# Patient Record
Sex: Male | Born: 1965 | Race: Black or African American | Hispanic: No | Marital: Married | Smoking: Never smoker
Health system: Southern US, Community
[De-identification: ages and names within clinical notes are randomized; demographics above are authoritative.]

## PROBLEM LIST (undated history)

## (undated) DIAGNOSIS — J189 Pneumonia, unspecified organism: Secondary | ICD-10-CM

## (undated) DIAGNOSIS — I509 Heart failure, unspecified: Secondary | ICD-10-CM

## (undated) DIAGNOSIS — I10 Essential (primary) hypertension: Secondary | ICD-10-CM

## (undated) DIAGNOSIS — J45909 Unspecified asthma, uncomplicated: Secondary | ICD-10-CM

---

## 1998-06-10 ENCOUNTER — Emergency Department (HOSPITAL_COMMUNITY): Admission: EM | Admit: 1998-06-10 | Discharge: 1998-06-10 | Payer: Self-pay

## 2010-07-29 ENCOUNTER — Emergency Department (INDEPENDENT_AMBULATORY_CARE_PROVIDER_SITE_OTHER): Payer: Self-pay

## 2010-07-29 ENCOUNTER — Emergency Department (HOSPITAL_BASED_OUTPATIENT_CLINIC_OR_DEPARTMENT_OTHER)
Admission: EM | Admit: 2010-07-29 | Discharge: 2010-07-30 | Disposition: A | Discharge status: Home / Self Care | Payer: Self-pay | Attending: Emergency Medicine | Admitting: Emergency Medicine

## 2010-07-29 DIAGNOSIS — I10 Essential (primary) hypertension: Secondary | ICD-10-CM | POA: Insufficient documentation

## 2010-07-29 DIAGNOSIS — M542 Cervicalgia: Secondary | ICD-10-CM

## 2010-07-29 DIAGNOSIS — X58XXXA Exposure to other specified factors, initial encounter: Secondary | ICD-10-CM | POA: Insufficient documentation

## 2010-07-29 DIAGNOSIS — Y92009 Unspecified place in unspecified non-institutional (private) residence as the place of occurrence of the external cause: Secondary | ICD-10-CM | POA: Insufficient documentation

## 2010-07-29 DIAGNOSIS — W19XXXA Unspecified fall, initial encounter: Secondary | ICD-10-CM

## 2010-07-29 DIAGNOSIS — S139XXA Sprain of joints and ligaments of unspecified parts of neck, initial encounter: Secondary | ICD-10-CM | POA: Insufficient documentation

## 2010-07-29 DIAGNOSIS — M5030 Other cervical disc degeneration, unspecified cervical region: Secondary | ICD-10-CM

## 2012-02-12 ENCOUNTER — Encounter (HOSPITAL_BASED_OUTPATIENT_CLINIC_OR_DEPARTMENT_OTHER): Payer: Self-pay | Admitting: *Deleted

## 2012-02-12 ENCOUNTER — Emergency Department (HOSPITAL_BASED_OUTPATIENT_CLINIC_OR_DEPARTMENT_OTHER)
Admission: EM | Admit: 2012-02-12 | Discharge: 2012-02-12 | Disposition: A | Discharge status: Home / Self Care | Payer: BC Managed Care – PPO | Attending: Emergency Medicine | Admitting: Emergency Medicine

## 2012-02-12 DIAGNOSIS — R197 Diarrhea, unspecified: Secondary | ICD-10-CM | POA: Insufficient documentation

## 2012-02-12 DIAGNOSIS — A088 Other specified intestinal infections: Secondary | ICD-10-CM | POA: Insufficient documentation

## 2012-02-12 DIAGNOSIS — R112 Nausea with vomiting, unspecified: Secondary | ICD-10-CM | POA: Insufficient documentation

## 2012-02-12 DIAGNOSIS — A084 Viral intestinal infection, unspecified: Secondary | ICD-10-CM

## 2012-02-12 DIAGNOSIS — I10 Essential (primary) hypertension: Secondary | ICD-10-CM | POA: Insufficient documentation

## 2012-02-12 DIAGNOSIS — Z79899 Other long term (current) drug therapy: Secondary | ICD-10-CM | POA: Insufficient documentation

## 2012-02-12 HISTORY — DX: Essential (primary) hypertension: I10

## 2012-02-12 LAB — COMPREHENSIVE METABOLIC PANEL
AST: 30 U/L (ref 0–37)
Albumin: 3.1 g/dL — ABNORMAL LOW (ref 3.5–5.2)
Chloride: 105 mEq/L (ref 96–112)
Creatinine, Ser: 1.8 mg/dL — ABNORMAL HIGH (ref 0.50–1.35)
Sodium: 141 mEq/L (ref 135–145)
Total Bilirubin: 1.5 mg/dL — ABNORMAL HIGH (ref 0.3–1.2)

## 2012-02-12 LAB — URINE MICROSCOPIC-ADD ON

## 2012-02-12 LAB — URINALYSIS, ROUTINE W REFLEX MICROSCOPIC
Leukocytes, UA: NEGATIVE
Nitrite: NEGATIVE
Specific Gravity, Urine: 1.025 (ref 1.005–1.030)
Urobilinogen, UA: 1 mg/dL (ref 0.0–1.0)
pH: 6.5 (ref 5.0–8.0)

## 2012-02-12 LAB — CBC WITH DIFFERENTIAL/PLATELET
Basophils Absolute: 0 10*3/uL (ref 0.0–0.1)
Basophils Relative: 0 % (ref 0–1)
HCT: 40.8 % (ref 39.0–52.0)
MCHC: 32.4 g/dL (ref 30.0–36.0)
Monocytes Absolute: 0.6 10*3/uL (ref 0.1–1.0)
Neutro Abs: 4.7 10*3/uL (ref 1.7–7.7)
Neutrophils Relative %: 71 % (ref 43–77)
Platelets: 219 10*3/uL (ref 150–400)
RDW: 16.7 % — ABNORMAL HIGH (ref 11.5–15.5)

## 2012-02-12 MED ORDER — ONDANSETRON HCL 4 MG PO TABS: 4.0000 mg | ORAL_TABLET | Freq: Three times a day (TID) | ORAL | Status: DC | PRN

## 2012-02-12 MED ORDER — ONDANSETRON HCL 4 MG/2ML IJ SOLN
4.0000 mg | Freq: Once | INTRAMUSCULAR | Status: AC
  Administered 2012-02-12: 4 mg via INTRAVENOUS
  Filled 2012-02-12: qty 2

## 2012-02-12 MED ORDER — SODIUM CHLORIDE 0.9 % IV BOLUS (SEPSIS)
1000.0000 mL | Freq: Once | INTRAVENOUS | Status: AC
  Administered 2012-02-12: 1000 mL via INTRAVENOUS

## 2012-02-12 MED ORDER — MORPHINE SULFATE 4 MG/ML IJ SOLN
4.0000 mg | Freq: Once | INTRAMUSCULAR | Status: AC
  Administered 2012-02-12: 4 mg via INTRAVENOUS
  Filled 2012-02-12: qty 1

## 2012-02-12 NOTE — ED Notes (Signed)
Abdominal pain x 1 week. Diarrhea, fever and vomiting.

## 2012-02-12 NOTE — ED Provider Notes (Signed)
History  This chart was scribed for Charles B. Bernette Mayers, MD by Ardeen Jourdain, ED Scribe. This patient was seen in room MH02/MH02 and the patient's care was started at 1543.  CSN: [id number]  Arrival date & time 02/12/12  1457   First MD Initiated Contact with Patient 02/12/12 1543      Chief Complaint  Patient presents with  . Abdominal Pain     The history is provided by the patient. No language interpreter was used.    Brandon Snyder is a 47 y.o. male who presents to the Emergency Department complaining of gradually worsening abdominal pain that began 1 week ago with associated loss of appetite, diarrhea, nausea and emesis. He states he had the flu last week but the symptoms resolved themselves. He describes the pain as constant and diffuse. He states the pain is aggravated by movement and walking. He describes his stool yesterday as a bright green color but states he has been drinking purple Gatorade. He reports he has been unable to take his HTN medication due to the emesis.    Past Medical History  Diagnosis Date  . Hypertension     History reviewed. No pertinent past surgical history.  No family history on file.  History  Substance Use Topics  . Smoking status: Never Smoker   . Smokeless tobacco: Not on file  . Alcohol Use: Yes      Review of Systems  All other systems reviewed and are negative.   A complete 10 system review of systems was obtained and all systems are negative except as noted in the HPI and PMH.    Allergies  Review of patient's allergies indicates no known allergies.  Home Medications   Current Outpatient Rx  Name  Route  Sig  Dispense  Refill  . LISINOPRIL PO   Oral   Take by mouth.           Triage Vitals: BP 168/120  Pulse 84  Temp 98.7 F (37.1 C) (Oral)  Resp 18  Ht 6\' 2"  (1.88 m)  Wt 295 lb (133.811 kg)  BMI 37.88 kg/m2  SpO2 98%  Physical Exam  Nursing note and vitals reviewed. Constitutional: He is oriented to  person, place, and time. He appears well-developed and well-nourished.  HENT:  Head: Normocephalic and atraumatic.  Eyes: Conjunctivae normal and EOM are normal. Pupils are equal, round, and reactive to light.  Neck: Normal range of motion. Neck supple.  Cardiovascular: Normal rate, regular rhythm, normal heart sounds and intact distal pulses.  Exam reveals no gallop and no friction rub.   No murmur heard. Pulmonary/Chest: Effort normal and breath sounds normal. No respiratory distress.  Abdominal: Soft. Bowel sounds are normal. He exhibits no distension. There is tenderness. There is guarding. There is no rebound.       Moderate diffuse tenderness  Musculoskeletal: Normal range of motion. He exhibits edema. He exhibits no tenderness.       Trace edema  Neurological: He is alert and oriented to person, place, and time. He has normal strength. No cranial nerve deficit or sensory deficit.  Skin: Skin is warm and dry. No rash noted.  Psychiatric: He has a normal mood and affect. His behavior is normal.    ED Course  Procedures (including critical care time)  DIAGNOSTIC STUDIES: Oxygen Saturation is 98% on room air, normal by my interpretation.    COORDINATION OF CARE:  3:45 PM: Discussed treatment plan which includes IV fluids, UA, CBC, CMP,  pain medication and anti-nausea medication with pt at bedside and pt agreed to plan.   5:05 PM: Pt rechecked, pt states he is feeling better, abdomen now benign     Results for orders placed during the hospital encounter of 02/12/12  URINALYSIS, ROUTINE W REFLEX MICROSCOPIC      Component Value Range   Color, Urine AMBER (*) YELLOW   APPearance CLEAR  CLEAR   Specific Gravity, Urine 1.025  1.005 - 1.030   pH 6.5  5.0 - 8.0   Glucose, UA NEGATIVE  NEGATIVE mg/dL   Hgb urine dipstick NEGATIVE  NEGATIVE   Bilirubin Urine SMALL (*) NEGATIVE   Ketones, ur NEGATIVE  NEGATIVE mg/dL   Protein, ur >914 (*) NEGATIVE mg/dL   Urobilinogen, UA 1.0  0.0  - 1.0 mg/dL   Nitrite NEGATIVE  NEGATIVE   Leukocytes, UA NEGATIVE  NEGATIVE  URINE MICROSCOPIC-ADD ON      Component Value Range   Squamous Epithelial / LPF RARE  RARE   Bacteria, UA RARE  RARE  CBC WITH DIFFERENTIAL      Component Value Range   WBC 6.6  4.0 - 10.5 K/uL   RBC 4.99  4.22 - 5.81 MIL/uL   Hemoglobin 13.2  13.0 - 17.0 g/dL   HCT 78.2  95.6 - 21.3 %   MCV 81.8  78.0 - 100.0 fL   MCH 26.5  26.0 - 34.0 pg   MCHC 32.4  30.0 - 36.0 g/dL   RDW 08.6 (*) 57.8 - 46.9 %   Platelets 219  150 - 400 K/uL   Neutrophils Relative 71  43 - 77 %   Neutro Abs 4.7  1.7 - 7.7 K/uL   Lymphocytes Relative 17  12 - 46 %   Lymphs Abs 1.1  0.7 - 4.0 K/uL   Monocytes Relative 9  3 - 12 %   Monocytes Absolute 0.6  0.1 - 1.0 K/uL   Eosinophils Relative 2  0 - 5 %   Eosinophils Absolute 0.1  0.0 - 0.7 K/uL   Basophils Relative 0  0 - 1 %   Basophils Absolute 0.0  0.0 - 0.1 K/uL  COMPREHENSIVE METABOLIC PANEL      Component Value Range   Sodium 141  135 - 145 mEq/L   Potassium 3.9  3.5 - 5.1 mEq/L   Chloride 105  96 - 112 mEq/L   CO2 25  19 - 32 mEq/L   Glucose, Bld 99  70 - 99 mg/dL   BUN 23  6 - 23 mg/dL   Creatinine, Ser 6.29 (*) 0.50 - 1.35 mg/dL   Calcium 8.7  8.4 - 52.8 mg/dL   Total Protein 5.5 (*) 6.0 - 8.3 g/dL   Albumin 3.1 (*) 3.5 - 5.2 g/dL   AST 30  0 - 37 U/L   ALT 38  0 - 53 U/L   Alkaline Phosphatase 57  39 - 117 U/L   Total Bilirubin 1.5 (*) 0.3 - 1.2 mg/dL   GFR calc non Af Amer 44 (*) >90 mL/min   GFR calc Af Amer 51 (*) >90 mL/min  LIPASE, BLOOD      Component Value Range   Lipase 13  11 - 59 U/L     No results found.   No diagnosis found.    MDM  Labs unremarkable. Pt feeling better. Likely a viral process. Doubt acute surgical process. Plan for discharge home with nausea meds as needed.     I  personally performed the services described in this documentation, which was scribed in my presence. The recorded information has been reviewed and is  accurate.       Charles B. Bernette Mayers, MD 02/12/12 1801

## 2012-04-03 ENCOUNTER — Emergency Department (HOSPITAL_BASED_OUTPATIENT_CLINIC_OR_DEPARTMENT_OTHER)
Admission: EM | Admit: 2012-04-03 | Discharge: 2012-04-04 | Disposition: A | Discharge status: Home / Self Care | Payer: BC Managed Care – PPO | Attending: Emergency Medicine | Admitting: Emergency Medicine

## 2012-04-03 ENCOUNTER — Emergency Department (HOSPITAL_BASED_OUTPATIENT_CLINIC_OR_DEPARTMENT_OTHER): Payer: BC Managed Care – PPO

## 2012-04-03 ENCOUNTER — Encounter (HOSPITAL_BASED_OUTPATIENT_CLINIC_OR_DEPARTMENT_OTHER): Payer: Self-pay | Admitting: *Deleted

## 2012-04-03 DIAGNOSIS — R609 Edema, unspecified: Secondary | ICD-10-CM | POA: Insufficient documentation

## 2012-04-03 DIAGNOSIS — I10 Essential (primary) hypertension: Secondary | ICD-10-CM | POA: Insufficient documentation

## 2012-04-03 DIAGNOSIS — I509 Heart failure, unspecified: Secondary | ICD-10-CM | POA: Insufficient documentation

## 2012-04-03 DIAGNOSIS — R0789 Other chest pain: Secondary | ICD-10-CM | POA: Insufficient documentation

## 2012-04-03 DIAGNOSIS — Z79899 Other long term (current) drug therapy: Secondary | ICD-10-CM | POA: Insufficient documentation

## 2012-04-03 NOTE — ED Notes (Signed)
Patient was diagnosed with pneumonia two weeks ago, finished antibiotic that was given. Feels like he's getting worse and states hes wheezing.

## 2012-04-03 NOTE — ED Provider Notes (Signed)
History     CSN: [id number]  Arrival date & time 04/03/12  2140   First MD Initiated Contact with Patient 04/03/12 2358      Chief Complaint  Patient presents with  . Shortness of Breath    (Consider location/radiation/quality/duration/timing/severity/associated sxs/prior treatment) HPI This 47 year old male with a history of hypertension and possibly CHF. He was seen in urgent care 2 weeks ago for shortness of breath and cough and was treated with an antibiotic for presumed pneumonia. Despite this he continues to have wheezing, dyspnea on exertion, orthopnea and generalized weakness. He continues to have paroxysms of cough sometimes with posttussive emesis. His appetite has been poor due to upset stomach after eating. The symptoms are moderate and worse with exertion and lying flat is noted. He has noted that his lower legs and feet have become edematous over the past few days. He has also had some intermittent sharp right parasternal pain today.  Past Medical History  Diagnosis Date  . Hypertension     History reviewed. No pertinent past surgical history.  No family history on file.  History  Substance Use Topics  . Smoking status: Never Smoker   . Smokeless tobacco: Not on file  . Alcohol Use: Yes      Review of Systems  All other systems reviewed and are negative.    Allergies  Review of patient's allergies indicates no known allergies.  Home Medications   Current Outpatient Rx  Name  Route  Sig  Dispense  Refill  . LISINOPRIL PO   Oral   Take by mouth.         . ondansetron (ZOFRAN) 4 MG tablet   Oral   Take 1 tablet (4 mg total) by mouth every 8 (eight) hours as needed for nausea.   12 tablet   0     BP 163/107  Pulse 97  Temp(Src) 98.6 F (37 C) (Oral)  Resp 30  Ht 6\' 2"  (1.88 m)  Wt 295 lb (133.811 kg)  BMI 37.86 kg/m2  SpO2 98%  Physical Exam General: Well-developed, well-nourished male in no acute distress; appearance consistent with  age of record HENT: normocephalic, atraumatic Eyes: pupils equal round and reactive to light; extraocular muscles intact Neck: supple Heart: regular rate and rhythm; no murmurs Lungs: clear to auscultation bilaterally Abdomen: soft; nondistended; bowel sounds present Extremities: No deformity; full range of motion; 2+ edema of the lower legs Neurologic: Awake, alert and oriented; motor function intact in all extremities and symmetric; no facial droop Skin: Warm and dry Psychiatric: Normal mood and affect    ED Course  Procedures (including critical care time)    MDM  Nursing notes and vitals signs, including pulse oximetry, reviewed.  Summary of this visit's results, reviewed by myself:  Labs:  Results for orders placed during the hospital encounter of 04/03/12 (from the past 24 hour(s))  CBC WITH DIFFERENTIAL     Status: None   Collection Time    04/04/12 12:10 AM      Result Value Range   WBC 5.7  4.0 - 10.5 K/uL   RBC 5.20  4.22 - 5.81 MIL/uL   Hemoglobin 13.8  13.0 - 17.0 g/dL   HCT 78.2  95.6 - 21.3 %   MCV 79.2  78.0 - 100.0 fL   MCH 26.5  26.0 - 34.0 pg   MCHC 33.5  30.0 - 36.0 g/dL   RDW 08.6  57.8 - 46.9 %   Platelets 249  150 -  400 K/uL   Neutrophils Relative 69  43 - 77 %   Neutro Abs 3.9  1.7 - 7.7 K/uL   Lymphocytes Relative 17  12 - 46 %   Lymphs Abs 1.0  0.7 - 4.0 K/uL   Monocytes Relative 11  3 - 12 %   Monocytes Absolute 0.6  0.1 - 1.0 K/uL   Eosinophils Relative 2  0 - 5 %   Eosinophils Absolute 0.1  0.0 - 0.7 K/uL   Basophils Relative 0  0 - 1 %   Basophils Absolute 0.0  0.0 - 0.1 K/uL  BASIC METABOLIC PANEL     Status: Abnormal   Collection Time    04/04/12 12:10 AM      Result Value Range   Sodium 138  135 - 145 mEq/L   Potassium 3.9  3.5 - 5.1 mEq/L   Chloride 101  96 - 112 mEq/L   CO2 24  19 - 32 mEq/L   Glucose, Bld 130 (*) 70 - 99 mg/dL   BUN 32 (*) 6 - 23 mg/dL   Creatinine, Ser 2.95 (*) 0.50 - 1.35 mg/dL   Calcium 8.7  8.4 - 62.1  mg/dL   GFR calc non Af Amer 47 (*) >90 mL/min   GFR calc Af Amer 54 (*) >90 mL/min  TROPONIN I     Status: None   Collection Time    04/04/12 12:10 AM      Result Value Range   Troponin I <0.30  <0.30 ng/mL  PRO B NATRIURETIC PEPTIDE     Status: Abnormal   Collection Time    04/04/12 12:10 AM      Result Value Range   Pro B Natriuretic peptide (BNP) 5852.0 (*) 0 - 125 pg/mL    Imaging Studies: Dg Chest 2 View  04/03/2012  *RADIOLOGY REPORT*  Clinical Data: Shortness of breath  CHEST - 2 VIEW  Comparison: None.  Findings: Cardiomegaly.  Central vascular congestion.  Mild interstitial prominence.  No confluent airspace opacity.  No pleural effusion or pneumothorax.  No acute osseous finding.  IMPRESSION: Cardiomegaly with central vascular congestion.  Mild interstitial prominence may reflect mild interstitial edema.   Original Report Authenticated By: Jearld Lesch, M.D.     EKG Interpretation:  Date & Time: 04/04/2012 12:21 AM  Rate: 90  Rhythm: normal sinus rhythm  QRS Axis: normal  Intervals: Prolonged QT  ST/T Wave abnormalities: Diffuse T-wave inversions  Conduction Disutrbances:left anterior fascicular block  Narrative Interpretation:   Old EKG Reviewed: none available  12:47 AM Patient states that he has not been taking medication for CHF in about 3 years. He states he does continue to take lisinopril. We will restart him on Lasix and have him followup with [facility].          Hanley Seamen, MD 04/04/12 [phone]

## 2012-04-03 NOTE — ED Notes (Signed)
Pt states he gets SOB with exertion and has been having bilateral lower leg swelling. Pt has pitting edema to bilateral lower extremities. SR on monitor, denies CP at this time but still c/o SOB.

## 2012-04-04 LAB — CBC WITH DIFFERENTIAL/PLATELET
Eosinophils Relative: 2 % (ref 0–5)
HCT: 41.2 % (ref 39.0–52.0)
Lymphocytes Relative: 17 % (ref 12–46)
Lymphs Abs: 1 10*3/uL (ref 0.7–4.0)
MCV: 79.2 fL (ref 78.0–100.0)
Monocytes Absolute: 0.6 10*3/uL (ref 0.1–1.0)
RBC: 5.2 MIL/uL (ref 4.22–5.81)
WBC: 5.7 10*3/uL (ref 4.0–10.5)

## 2012-04-04 LAB — BASIC METABOLIC PANEL
CO2: 24 mEq/L (ref 19–32)
Calcium: 8.7 mg/dL (ref 8.4–10.5)
Creatinine, Ser: 1.7 mg/dL — ABNORMAL HIGH (ref 0.50–1.35)
Glucose, Bld: 130 mg/dL — ABNORMAL HIGH (ref 70–99)

## 2012-04-04 MED ORDER — FUROSEMIDE 40 MG PO TABS: 40.0000 mg | ORAL_TABLET | Freq: Every day | ORAL | Status: DC

## 2012-04-04 MED ORDER — FUROSEMIDE 10 MG/ML IJ SOLN
40.0000 mg | Freq: Once | INTRAMUSCULAR | Status: AC
  Administered 2012-04-04: 40 mg via INTRAVENOUS
  Filled 2012-04-04: qty 4

## 2012-04-04 MED ORDER — PROMETHAZINE HCL 25 MG PO TABS: 25.0000 mg | ORAL_TABLET | Freq: Four times a day (QID) | ORAL | Status: DC | PRN

## 2012-04-04 NOTE — ED Notes (Signed)
MD at bedside. 

## 2012-05-07 ENCOUNTER — Emergency Department (HOSPITAL_BASED_OUTPATIENT_CLINIC_OR_DEPARTMENT_OTHER): Payer: Self-pay

## 2012-05-07 ENCOUNTER — Inpatient Hospital Stay (HOSPITAL_BASED_OUTPATIENT_CLINIC_OR_DEPARTMENT_OTHER)
Admission: EM | Admit: 2012-05-07 | Discharge: 2012-05-11 | DRG: 292 | Disposition: A | Discharge status: Home / Self Care | Payer: MEDICAID | Attending: Internal Medicine | Admitting: Internal Medicine

## 2012-05-07 ENCOUNTER — Encounter (HOSPITAL_BASED_OUTPATIENT_CLINIC_OR_DEPARTMENT_OTHER): Payer: Self-pay | Admitting: *Deleted

## 2012-05-07 ENCOUNTER — Other Ambulatory Visit: Payer: Self-pay

## 2012-05-07 DIAGNOSIS — I509 Heart failure, unspecified: Secondary | ICD-10-CM | POA: Diagnosis present

## 2012-05-07 DIAGNOSIS — I10 Essential (primary) hypertension: Secondary | ICD-10-CM | POA: Diagnosis present

## 2012-05-07 DIAGNOSIS — I110 Hypertensive heart disease with heart failure: Principal | ICD-10-CM | POA: Diagnosis present

## 2012-05-07 DIAGNOSIS — I472 Ventricular tachycardia, unspecified: Secondary | ICD-10-CM | POA: Diagnosis present

## 2012-05-07 DIAGNOSIS — N183 Chronic kidney disease, stage 3 unspecified: Secondary | ICD-10-CM | POA: Diagnosis present

## 2012-05-07 DIAGNOSIS — I4729 Other ventricular tachycardia: Secondary | ICD-10-CM | POA: Diagnosis present

## 2012-05-07 DIAGNOSIS — E876 Hypokalemia: Secondary | ICD-10-CM | POA: Diagnosis present

## 2012-05-07 DIAGNOSIS — I5021 Acute systolic (congestive) heart failure: Secondary | ICD-10-CM | POA: Diagnosis present

## 2012-05-07 HISTORY — DX: Unspecified asthma, uncomplicated: J45.909

## 2012-05-07 HISTORY — DX: Heart failure, unspecified: I50.9

## 2012-05-07 HISTORY — DX: Pneumonia, unspecified organism: J18.9

## 2012-05-07 LAB — CBC WITH DIFFERENTIAL/PLATELET
Basophils Absolute: 0 10*3/uL (ref 0.0–0.1)
Basophils Relative: 0 % (ref 0–1)
Eosinophils Relative: 4 % (ref 0–5)
HCT: 37.7 % — ABNORMAL LOW (ref 39.0–52.0)
Hemoglobin: 12.3 g/dL — ABNORMAL LOW (ref 13.0–17.0)
MCH: 26.2 pg (ref 26.0–34.0)
MCHC: 32.6 g/dL (ref 30.0–36.0)
MCV: 80.2 fL (ref 78.0–100.0)
Monocytes Absolute: 0.7 10*3/uL (ref 0.1–1.0)
Monocytes Relative: 13 % — ABNORMAL HIGH (ref 3–12)
RDW: 17.4 % — ABNORMAL HIGH (ref 11.5–15.5)

## 2012-05-07 LAB — COMPREHENSIVE METABOLIC PANEL
AST: 30 U/L (ref 0–37)
Albumin: 2.9 g/dL — ABNORMAL LOW (ref 3.5–5.2)
BUN: 18 mg/dL (ref 6–23)
Calcium: 8.9 mg/dL (ref 8.4–10.5)
Chloride: 104 mEq/L (ref 96–112)
Creatinine, Ser: 1.6 mg/dL — ABNORMAL HIGH (ref 0.50–1.35)
GFR calc non Af Amer: 51 mL/min — ABNORMAL LOW (ref >90)
Total Bilirubin: 1.2 mg/dL (ref 0.3–1.2)

## 2012-05-07 LAB — PRO B NATRIURETIC PEPTIDE: Pro B Natriuretic peptide (BNP): 6356 pg/mL — ABNORMAL HIGH (ref 0–125)

## 2012-05-07 LAB — TROPONIN I: Troponin I: <0.3 ng/mL (ref <0.30)

## 2012-05-07 MED ORDER — ALBUTEROL SULFATE (5 MG/ML) 0.5% IN NEBU
INHALATION_SOLUTION | RESPIRATORY_TRACT | Status: AC
  Administered 2012-05-07: 5 mg via RESPIRATORY_TRACT
  Filled 2012-05-07: qty 1

## 2012-05-07 MED ORDER — NITROGLYCERIN 2 % TD OINT
1.0000 [in_us] | TOPICAL_OINTMENT | Freq: Once | TRANSDERMAL | Status: AC
  Administered 2012-05-07: 1 [in_us] via TOPICAL
  Filled 2012-05-07: qty 1

## 2012-05-07 MED ORDER — FUROSEMIDE 10 MG/ML IJ SOLN: 40.0000 mg | Freq: Once | INTRAMUSCULAR | Status: AC

## 2012-05-07 MED ORDER — FUROSEMIDE 10 MG/ML IJ SOLN
INTRAMUSCULAR | Status: AC
  Administered 2012-05-07: 40 mg via INTRAVENOUS
  Filled 2012-05-07: qty 4

## 2012-05-07 MED ORDER — ASPIRIN 325 MG PO TABS
325.0000 mg | ORAL_TABLET | Freq: Once | ORAL | Status: AC
  Administered 2012-05-07: 325 mg via ORAL
  Filled 2012-05-07: qty 1

## 2012-05-07 MED ORDER — ALBUTEROL SULFATE (5 MG/ML) 0.5% IN NEBU: 5.0000 mg | INHALATION_SOLUTION | Freq: Once | RESPIRATORY_TRACT | Status: AC

## 2012-05-07 NOTE — ED Notes (Signed)
MD at bedside. 

## 2012-05-07 NOTE — ED Notes (Signed)
Pt became SOB while working. Just finished ABT tx 6 weeks ago for pneumonia. Pt hypertensive on scene per EMS.

## 2012-05-08 ENCOUNTER — Encounter (HOSPITAL_COMMUNITY): Payer: Self-pay | Admitting: *Deleted

## 2012-05-08 DIAGNOSIS — I10 Essential (primary) hypertension: Secondary | ICD-10-CM

## 2012-05-08 DIAGNOSIS — I509 Heart failure, unspecified: Secondary | ICD-10-CM

## 2012-05-08 LAB — TROPONIN I: Troponin I: <0.3 ng/mL (ref <0.30)

## 2012-05-08 MED ORDER — CARVEDILOL 25 MG PO TABS
25.0000 mg | ORAL_TABLET | Freq: Every day | ORAL | Status: DC
  Administered 2012-05-08 – 2012-05-11 (×4): 25 mg via ORAL
  Filled 2012-05-08 (×4): qty 1

## 2012-05-08 MED ORDER — BIOTENE DRY MOUTH MT LIQD
15.0000 mL | Freq: Two times a day (BID) | OROMUCOSAL | Status: DC
  Administered 2012-05-08 – 2012-05-11 (×7): 15 mL via OROMUCOSAL

## 2012-05-08 MED ORDER — ENOXAPARIN SODIUM 40 MG/0.4ML ~~LOC~~ SOLN: 40.0000 mg | SUBCUTANEOUS | Status: DC

## 2012-05-08 MED ORDER — FUROSEMIDE 10 MG/ML IJ SOLN
INTRAMUSCULAR | Status: AC
  Filled 2012-05-08: qty 8

## 2012-05-08 MED ORDER — CARVEDILOL 6.25 MG PO TABS
6.2500 mg | ORAL_TABLET | Freq: Two times a day (BID) | ORAL | Status: DC
  Administered 2012-05-08: 6.25 mg via ORAL
  Filled 2012-05-08 (×3): qty 1

## 2012-05-08 MED ORDER — FUROSEMIDE 10 MG/ML IJ SOLN: 60.0000 mg | Freq: Once | INTRAMUSCULAR | Status: DC

## 2012-05-08 MED ORDER — PNEUMOCOCCAL VAC POLYVALENT 25 MCG/0.5ML IJ INJ
0.5000 mL | INJECTION | INTRAMUSCULAR | Status: AC
Start: 2012-05-09 — End: 2012-05-09
  Administered 2012-05-09: 0.5 mL via INTRAMUSCULAR
  Filled 2012-05-08: qty 0.5

## 2012-05-08 MED ORDER — FUROSEMIDE 40 MG PO TABS
40.0000 mg | ORAL_TABLET | Freq: Every day | ORAL | Status: DC
  Administered 2012-05-08: 40 mg via ORAL
  Filled 2012-05-08: qty 1

## 2012-05-08 MED ORDER — SODIUM CHLORIDE 0.9 % IJ SOLN
3.0000 mL | Freq: Two times a day (BID) | INTRAMUSCULAR | Status: DC
  Administered 2012-05-08 – 2012-05-11 (×7): 3 mL via INTRAVENOUS

## 2012-05-08 MED ORDER — FUROSEMIDE 80 MG PO TABS
80.0000 mg | ORAL_TABLET | Freq: Two times a day (BID) | ORAL | Status: DC
  Filled 2012-05-08 (×2): qty 1

## 2012-05-08 MED ORDER — ONDANSETRON HCL 4 MG/2ML IJ SOLN: 4.0000 mg | Freq: Four times a day (QID) | INTRAMUSCULAR | Status: DC | PRN

## 2012-05-08 MED ORDER — LISINOPRIL 20 MG PO TABS
20.0000 mg | ORAL_TABLET | Freq: Every day | ORAL | Status: DC
  Administered 2012-05-08: 20 mg via ORAL
  Filled 2012-05-08: qty 1

## 2012-05-08 MED ORDER — SODIUM CHLORIDE 0.9 % IV SOLN: 250.0000 mL | INTRAVENOUS | Status: DC | PRN

## 2012-05-08 MED ORDER — ACETAMINOPHEN 325 MG PO TABS: 650.0000 mg | ORAL_TABLET | ORAL | Status: DC | PRN

## 2012-05-08 MED ORDER — FUROSEMIDE 10 MG/ML IJ SOLN
60.0000 mg | Freq: Two times a day (BID) | INTRAMUSCULAR | Status: DC
  Administered 2012-05-08 – 2012-05-11 (×6): 60 mg via INTRAVENOUS
  Filled 2012-05-08 (×7): qty 6

## 2012-05-08 MED ORDER — SODIUM CHLORIDE 0.9 % IJ SOLN
3.0000 mL | INTRAMUSCULAR | Status: DC | PRN
  Administered 2012-05-10: 3 mL via INTRAVENOUS

## 2012-05-08 MED ORDER — ENOXAPARIN SODIUM 80 MG/0.8ML ~~LOC~~ SOLN
65.0000 mg | SUBCUTANEOUS | Status: DC
  Administered 2012-05-09 – 2012-05-11 (×3): 65 mg via SUBCUTANEOUS
  Filled 2012-05-08 (×4): qty 0.8

## 2012-05-08 MED ORDER — ISOSORB DINITRATE-HYDRALAZINE 20-37.5 MG PO TABS
1.0000 | ORAL_TABLET | Freq: Three times a day (TID) | ORAL | Status: DC
  Administered 2012-05-08 – 2012-05-11 (×9): 1 via ORAL
  Filled 2012-05-08 (×12): qty 1

## 2012-05-08 NOTE — Plan of Care (Signed)
Problem: Phase II Progression Outcomes Goal: Pain controlled Outcome: Completed/Met Date Met:  05/08/12 Pt has not had any c/o pain

## 2012-05-08 NOTE — ED Provider Notes (Signed)
History     CSN: [id number]  Arrival date & time 05/07/12  2152   First MD Initiated Contact with Patient 05/07/12 2301      Chief Complaint  Patient presents with  . Shortness of Breath  . Hypertension    (Consider location/radiation/quality/duration/timing/severity/associated sxs/prior treatment) Patient is a 47 y.o. male presenting with shortness of breath and hypertension. The history is provided by the patient. No language interpreter was used.  Shortness of Breath Severity:  Severe Onset quality:  Sudden Timing:  Constant Progression:  Improving Chronicity:  Recurrent Context: not URI   Relieved by:  Rest and inhaler Worsened by:  Nothing tried Ineffective treatments:  None tried Associated symptoms: no cough and no fever   Risk factors: no tobacco use   Hypertension Associated symptoms include shortness of breath.    Past Medical History  Diagnosis Date  . Hypertension   . Asthma   . Pneumonia   . CHF (congestive heart failure)     History reviewed. No pertinent past surgical history.  History reviewed. No pertinent family history.  History  Substance Use Topics  . Smoking status: Never Smoker   . Smokeless tobacco: Not on file  . Alcohol Use: Yes     Comment: occasional      Review of Systems  Constitutional: Negative for fever.  Respiratory: Positive for shortness of breath. Negative for cough.   All other systems reviewed and are negative.    Allergies  Review of patient's allergies indicates no known allergies.  Home Medications  No current outpatient prescriptions on file.  BP 178/95  Pulse 82  Temp(Src) 97.5 F (36.4 C) (Oral)  Resp 22  Ht 6\' 2"  (1.88 m)  Wt 299 lb 6.4 oz (135.807 kg)  BMI 38.42 kg/m2  SpO2 97%  Physical Exam  Constitutional: He is oriented to person, place, and time. He appears well-developed and well-nourished.  HENT:  Head: Normocephalic and atraumatic.  Mouth/Throat: Oropharynx is clear and moist.   Eyes: Conjunctivae are normal. Pupils are equal, round, and reactive to light.  Neck: Normal range of motion. Neck supple.  Cardiovascular: Normal rate, regular rhythm and intact distal pulses.   Pulmonary/Chest: He has wheezes. He has rales.  Abdominal: Soft. Bowel sounds are normal. There is no tenderness. There is no rebound and no guarding.  Musculoskeletal: Normal range of motion. He exhibits edema.  Neurological: He is alert and oriented to person, place, and time.  Skin: Skin is warm and dry. He is not diaphoretic.  Psychiatric: He has a normal mood and affect.    ED Course  Procedures (including critical care time)  Labs Reviewed  CBC WITH DIFFERENTIAL - Abnormal; Notable for the following:    Hemoglobin 12.3 (*)    HCT 37.7 (*)    RDW 17.4 (*)    Monocytes Relative 13 (*)    All other components within normal limits  COMPREHENSIVE METABOLIC PANEL - Abnormal; Notable for the following:    Creatinine, Ser 1.60 (*)    Albumin 2.9 (*)    Alkaline Phosphatase 158 (*)    GFR calc non Af Amer 51 (*)    GFR calc Af Amer 59 (*)    All other components within normal limits  PRO B NATRIURETIC PEPTIDE - Abnormal; Notable for the following:    Pro B Natriuretic peptide (BNP) 6356.0 (*)    All other components within normal limits  TROPONIN I   Dg Chest 2 View  05/07/2012  *RADIOLOGY REPORT*  Clinical Data: Shortness of breath.  CHEST - 2 VIEW  Comparison: April 03, 2012.  Findings: Stable mild cardiomegaly and central pulmonary vascular congestion.  No pleural effusion or pneumothorax is noted.  No acute pulmonary disease is noted.  Bony thorax is intact.  IMPRESSION: No acute cardiopulmonary abnormality seen.   Original Report Authenticated By: Lupita Raider.,  M.D.      1. CHF (congestive heart failure)       MDM   Date: 05/08/2012  Rate: 75  Rhythm: normal sinus rhythm  QRS Axis: normal  Intervals: normal  ST/T Wave abnormalities: nonspecific ST changes  Conduction  Disutrbances:none  Narrative Interpretation:   Old EKG Reviewed: unchanged     CHF admit  Medications  aspirin tablet 325 mg (325 mg Oral Given 05/07/12 2310)  furosemide (LASIX) injection 40 mg (40 mg Intravenous Given 05/07/12 2311)  albuterol (PROVENTIL) (5 MG/ML) 0.5% nebulizer solution 5 mg (5 mg Nebulization Given 05/07/12 2310)  nitroGLYCERIN (NITROGLYN) 2 % ointment 1 inch (1 inch Topical Given 05/07/12 2331)       Amika Tassin K Averill Winters-Rasch, MD 05/08/12 [phone]

## 2012-05-08 NOTE — Progress Notes (Signed)
*  PRELIMINARY RESULTS* Echocardiogram 2D Echocardiogram has been performed.  Brandon Snyder 05/08/2012, 2:37 PM

## 2012-05-08 NOTE — H&P (Signed)
[facility] History and Physical  Brandon Snyder VWU:[id number] DOB: [birth year] DOA: 05/07/2012  Referring physician: ED PCP: No primary provider on file.  Specialists: Does have cardiologist in town  Chief Complaint: SOB, HTN  HPI: Brandon Snyder is a 47 y.o. male with PMH of CHF, who was at work today when he began to develop onset of SOB, DOE.  EMS was called and the patient was noted to have BP of 200s/110s.  He states he took his medication as prescribed which includes lisinopril 20mg  once a day and coreg 6.25mg  "2 pills once a day".  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  He also describes orthopnea, non-productive cough.  In the ED he was treated with lasix, NTG ointment to get his BP down and transferred to [facility] for admission.  By the time I am evaluating him in his hospital room he states he is essentially back to his home baseline from a symptom standpoint.  Review of Systems: 12 systems reviewed and otherwise negative.  Past Medical History  Diagnosis Date  . Hypertension   . Asthma   . Pneumonia   . CHF (congestive heart failure)    History reviewed. No pertinent past surgical history. Social History:  reports that he has never smoked. He does not have any smokeless tobacco history on file. He reports that  drinks alcohol. He reports that he does not use illicit drugs.   No Known Allergies  History reviewed. No pertinent family history.  Prior to Admission medications   Medication Sig Start Date End Date Taking? Authorizing Provider  furosemide (LASIX) 40 MG tablet Take 1 tablet (40 mg total) by mouth daily. 04/04/12  Yes John L Molpus, MD  LISINOPRIL PO Take by mouth.   Yes Historical Provider, MD  ondansetron (ZOFRAN) 4 MG tablet Take 1 tablet (4 mg total) by mouth every 8 (eight) hours as needed for nausea. 02/12/12   Charles B. Bernette Mayers, MD  promethazine (PHENERGAN) 25 MG tablet Take 1 tablet (25 mg total) by mouth every 6 (six)  hours as needed for nausea. 04/04/12   Hanley Seamen, MD   Physical Exam: Filed Vitals:   05/07/12 2330 05/08/12 0052 05/08/12 0201 05/08/12 0340  BP: 180/121 167/92 178/95 162/100  Pulse: 81 80 82   Temp:   97.5 F (36.4 C)   TempSrc:   Oral   Resp: 25 26 22    Height:   6\' 2"  (1.88 m)   Weight:   135.807 kg (299 lb 6.4 oz)   SpO2: 100% 100% 97% 100%    General:  NAD, resting comfortably in bed Eyes: PEERLA EOMI ENT: mucous membranes moist Neck: supple w/o JVD Cardiovascular: RRR w/o MRG Respiratory: CTA B Abdomen: soft, nt, nd, bs+ Skin: no rash nor lesion Musculoskeletal: MAE, full ROM all 4 extremities Psychiatric: normal tone and affect Neurologic: AAOx3, grossly non-focal  Labs on Admission:  Basic Metabolic Panel:  Recent Labs Lab 05/07/12 2205  NA 141  K 4.1  CL 104  CO2 29  GLUCOSE 85  BUN 18  CREATININE 1.60*  CALCIUM 8.9   Liver Function Tests:  Recent Labs Lab 05/07/12 2205  AST 30  ALT 25  ALKPHOS 158*  BILITOT 1.2  PROT 6.6  ALBUMIN 2.9*   No results found for this basename: LIPASE, AMYLASE,  in the last 168 hours No results found for this basename: AMMONIA,  in the last 168 hours CBC:  Recent  Labs Lab 05/07/12 2205  WBC 5.2  NEUTROABS 3.4  HGB 12.3*  HCT 37.7*  MCV 80.2  PLT 216   Cardiac Enzymes:  Recent Labs Lab 05/07/12 2205  TROPONINI <0.30    BNP (last 3 results)  Recent Labs  04/04/12 0010 05/07/12 2205  PROBNP 5852.0* 6356.0*   CBG: No results found for this basename: GLUCAP,  in the last 168 hours  Radiological Exams on Admission: Dg Chest 2 View  05/07/2012  *RADIOLOGY REPORT*  Clinical Data: Shortness of breath.  CHEST - 2 VIEW  Comparison: April 03, 2012.  Findings: Stable mild cardiomegaly and central pulmonary vascular congestion.  No pleural effusion or pneumothorax is noted.  No acute pulmonary disease is noted.  Bony thorax is intact.  IMPRESSION: No acute cardiopulmonary abnormality seen.    Original Report Authenticated By: Lupita Raider.,  M.D.     EKG: Independently reviewed.  Assessment/Plan Principal Problem:   Acute exacerbation of CHF (congestive heart failure) Active Problems:   HTN (hypertension)   1. Acute exacerbation of CHF - due to HTN with BPs of 200s/110s, am suspicious that patient may be suffering from rebound HTN from coming off of his beta blocker if his coreg is dosed daily instead of BID but patient is not 100% sure of the med he is on.  Put in request for pharm to review med rec and call his pharmacy in the morning when they open.  With regards to his acute symptoms they have all essentially resolved at this point and he is back to his home baseline, anticipate discharge later today as I discussed with patient. 2. HTN - continue home meds but switching coreg to 6.25 BID dosing.    Code Status: Full Code (must indicate code status--if unknown or must be presumed, indicate so) Family Communication: No family in room (indicate person spoken with, if applicable, with phone number if by telephone) Disposition Plan: Admit to obs, home later today (indicate anticipated LOS)  Time spent: 50 min  Tacy Chavis M. [facility] Pager [phone]  If 7PM-7AM, please contact night-coverage [email or web address] Password [facility] 05/08/2012, 6:05 AM

## 2012-05-08 NOTE — Progress Notes (Signed)
[facility] PROGRESS NOTE  [name]:[id number] DOB: 1966-01-05 DOA: 05/07/2012 PCP: No primary provider on file.  Assessment/Plan: Principal Problem:   Acute exacerbation of CHF (congestive heart failure) Active Problems:   HTN (hypertension)     Acute exacerbation of CHF -  Echo pending  due to HTN with BPs of 200s/110s,patient may be suffering from rebound HTN from coming off of his beta blocker if his coreg is dosed daily instead of BID but patient is not 100% sure of the med he is on.  Started lasix iv . With regards to his acute symptoms they have all essentially resolved at this point and he is back to his home baseline, anticipate tomorrow He sees Dr Jacinto Halim , has follow up next Monday  HTN resume home meds ,    CKD , stage 3 baseline unknown, follow as started on lasix    Code Status: full Family Communication: family updated about patient's clinical progress Disposition Plan:  As above    Brief narrative: Brandon Snyder is a 47 y.o. male with PMH of CHF, who was at work today when he began to develop onset of SOB, DOE. EMS was called and the patient was noted to have BP of 200s/110s. He states he took his medication as prescribed which includes lisinopril 20mg  once a day and coreg 6.25mg  "2 pills once a day". 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 He also describes orthopnea, non-productive cough.  In the ED he was treated with lasix, NTG ointment to get his BP down and transferred to [facility] for admission. By the time I am evaluating him in his hospital room he states he is essentially back to his home baseline from a symptom standpoint.   Consultants:     Procedures:     Antibiotics:     HPI/Subjective: Denies SOB   Objective: Filed Vitals:   05/08/12 0201 05/08/12 0340 05/08/12 0609 05/08/12 0613  BP: 178/95 162/100 172/100   Pulse: 82  76   Temp: 97.5 F (36.4 C)  98.3 F (36.8 C)   TempSrc: Oral  Oral    Resp: 22  21   Height: 6\' 2"  (1.88 m)     Weight: 135.807 kg (299 lb 6.4 oz)   134.809 kg (297 lb 3.2 oz)  SpO2: 97% 100% 100%     Intake/Output Summary (Last 24 hours) at 05/08/12 1200 Last data filed at 05/08/12 1000  Gross per 24 hour  Intake    890 ml  Output   1875 ml  Net   -985 ml    Exam: General: NAD, resting comfortably in bed  Eyes: PEERLA EOMI  ENT: mucous membranes moist  Neck: supple w/o JVD  Cardiovascular: RRR w/o MRG  Respiratory: CTA B  Abdomen: soft, nt, nd, bs+  Skin: no rash nor lesion  Musculoskeletal: MAE, full ROM all 4 extremities  Psychiatric: normal tone and affect  Neurologic: AAOx3, grossly non-focal    Data Reviewed: Basic Metabolic Panel:  Recent Labs Lab 05/07/12 2205  NA 141  K 4.1  CL 104  CO2 29  GLUCOSE 85  BUN 18  CREATININE 1.60*  CALCIUM 8.9    Liver Function Tests:  Recent Labs Lab 05/07/12 2205  AST 30  ALT 25  ALKPHOS 158*  BILITOT 1.2  PROT 6.6  ALBUMIN 2.9*   No results found for this basename: LIPASE, AMYLASE,  in the last 168 hours No results found for  this basename: AMMONIA,  in the last 168 hours  CBC:  Recent Labs Lab 05/07/12 2205  WBC 5.2  NEUTROABS 3.4  HGB 12.3*  HCT 37.7*  MCV 80.2  PLT 216    Cardiac Enzymes:  Recent Labs Lab 05/07/12 2205 05/08/12 0629  TROPONINI <0.30 <0.30   BNP (last 3 results)  Recent Labs  04/04/12 0010 05/07/12 2205  PROBNP 5852.0* 6356.0*     CBG: No results found for this basename: GLUCAP,  in the last 168 hours  No results found for this or any previous visit (from the past 240 hour(s)).   Studies: Dg Chest 2 View  05/07/2012  *RADIOLOGY REPORT*  Clinical Data: Shortness of breath.  CHEST - 2 VIEW  Comparison: April 03, 2012.  Findings: Stable mild cardiomegaly and central pulmonary vascular congestion.  No pleural effusion or pneumothorax is noted.  No acute pulmonary disease is noted.  Bony thorax is intact.  IMPRESSION: No acute  cardiopulmonary abnormality seen.   Original Report Authenticated By: Lupita Raider.,  M.D.     Scheduled Meds: . antiseptic oral rinse  15 mL Mouth Rinse BID  . carvedilol  6.25 mg Oral BID WC  . furosemide  40 mg Oral Daily  . lisinopril  20 mg Oral Daily  . [START ON 05/09/2012] pneumococcal 23 valent vaccine  0.5 mL Intramuscular Tomorrow-1000  . sodium chloride  3 mL Intravenous Q12H   Continuous Infusions:   Principal Problem:   Acute exacerbation of CHF (congestive heart failure) Active Problems:   HTN (hypertension)    Time spent: 40 minutes   [facility]  [facility] Pager [phone]. If 8PM-8AM, please contact night-coverage at [email or web address], password [facility] 05/08/2012, 12:00 PM  LOS: 1 day

## 2012-05-08 NOTE — Progress Notes (Signed)
Called [facility] Admissions to make sure patient is on the list for admitting physician to come and admit patient. She verified the patient name, the emergency department he transferred from and confirmed he was on the list. Timika Muench, Melida Quitter, RN

## 2012-05-09 LAB — BASIC METABOLIC PANEL
CO2: 35 mEq/L — ABNORMAL HIGH (ref 19–32)
Calcium: 8.6 mg/dL (ref 8.4–10.5)
GFR calc non Af Amer: 55 mL/min — ABNORMAL LOW (ref >90)
Sodium: 140 mEq/L (ref 135–145)

## 2012-05-09 MED ORDER — LEVALBUTEROL HCL 0.63 MG/3ML IN NEBU
0.6300 mg | INHALATION_SOLUTION | Freq: Four times a day (QID) | RESPIRATORY_TRACT | Status: DC | PRN
  Administered 2012-05-09: 0.63 mg via RESPIRATORY_TRACT
  Filled 2012-05-09: qty 3

## 2012-05-09 MED ORDER — POTASSIUM CHLORIDE CRYS ER 20 MEQ PO TBCR
20.0000 meq | EXTENDED_RELEASE_TABLET | Freq: Two times a day (BID) | ORAL | Status: AC
  Administered 2012-05-09 (×2): 20 meq via ORAL
  Filled 2012-05-09 (×2): qty 1

## 2012-05-09 NOTE — Plan of Care (Signed)
Problem: Phase I Progression Outcomes Goal: EF % per last Echo/documented,Core Reminder form on chart Outcome: Completed/Met Date Met:  05/09/12 EF per last echo on 05/08/2012 was 30-35%

## 2012-05-09 NOTE — Progress Notes (Signed)
Utilization Review Completed.   Shivon Hackel, RN, BSN Nurse Case Manager  [phone]  

## 2012-05-09 NOTE — Progress Notes (Signed)
[facility] PROGRESS NOTE  DVID PENDRY ZOX:[id number] DOB: Mar 10, 1965 DOA: 05/07/2012 PCP: No primary provider on file.  Assessment/Plan: Principal Problem:   Acute exacerbation of CHF (congestive heart failure) Active Problems:   HTN (hypertension)    Acute systolic exacerbation of CHF -  Echo shows EF of 30-35% due to HTN with BPs of 200s/110s,patient may be suffering from rebound HTN from coming off of his beta blocker Continue diuresis with IV Lasix Follow electrolytes and kidney function . With regards to his acute symptoms they have all essentially resolved although he does need further diuresis  He sees Dr Jacinto Halim , has follow up next Monday      HTN resume home meds  ,  CKD , stage 3 baseline 1.5-1.8, follow as started on lasix    Code Status: full  Family Communication: family updated about patient's clinical progress  Disposition Plan: Probable discharge tomorrow   Brief narrative:  Brandon Snyder is a 47 y.o. male with PMH of CHF, who was at work today when he began to develop onset of SOB, DOE. EMS was called and the patient was noted to have BP of 200s/110s. He states he took his medication as prescribed which includes lisinopril 20mg  once a day and coreg 6.25mg  "2 pills once a day". 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 He also describes orthopnea, non-productive cough.  In the ED he was treated with lasix, NTG ointment to get his BP down and transferred to [facility] for admission. By the time I am evaluating him in his hospital room he states he is essentially back to his home baseline from a symptom standpoint.  Consultants:  Procedures:  Antibiotics:      HPI/Subjective: Less short of breath today, feels a lot better than yesterday  Objective: Filed Vitals:   05/08/12 0613 05/08/12 2045 05/09/12 0436 05/09/12 0450  BP:  138/87 158/103 152/94  Pulse:  58 69 66  Temp:  98.1 F (36.7 C) 97.9 F (36.6 C)   TempSrc:   Oral Oral   Resp:  18 20   Height:      Weight: 134.809 kg (297 lb 3.2 oz)  132.586 kg (292 lb 4.8 oz)   SpO2:  98% 96%     Intake/Output Summary (Last 24 hours) at 05/09/12 0811 Last data filed at 05/09/12 0453  Gross per 24 hour  Intake   1263 ml  Output   3175 ml  Net  -1912 ml    Exam:  HENT:  Head: Atraumatic.  Nose: Nose normal.  Mouth/Throat: Oropharynx is clear and moist.  Eyes: Conjunctivae are normal. Pupils are equal, round, and reactive to light. No scleral icterus.  Neck: Neck supple. No tracheal deviation present.  Cardiovascular: Normal rate, regular rhythm, normal heart sounds and intact distal pulses.  Pulmonary/Chest: Effort normal and breath sounds normal. No respiratory distress.  Abdominal: Soft. Normal appearance and bowel sounds are normal. She exhibits no distension. There is no tenderness.  Musculoskeletal: She exhibits no edema and no tenderness.  Neurological: She is alert. No cranial nerve deficit.    Data Reviewed: Basic Metabolic Panel:  Recent Labs Lab 05/07/12 2205 05/09/12 0517  NA 141 140  K 4.1 3.3*  CL 104 101  CO2 29 35*  GLUCOSE 85 83  BUN 18 15  CREATININE 1.60* 1.50*  CALCIUM 8.9 8.6    Liver Function Tests:  Recent Labs Lab 05/07/12 2205  AST 30  ALT  25  ALKPHOS 158*  BILITOT 1.2  PROT 6.6  ALBUMIN 2.9*   No results found for this basename: LIPASE, AMYLASE,  in the last 168 hours No results found for this basename: AMMONIA,  in the last 168 hours  CBC:  Recent Labs Lab 05/07/12 2205  WBC 5.2  NEUTROABS 3.4  HGB 12.3*  HCT 37.7*  MCV 80.2  PLT 216    Cardiac Enzymes:  Recent Labs Lab 05/07/12 2205 05/08/12 0629 05/08/12 1200 05/08/12 1747  TROPONINI <0.30 <0.30 <0.30 <0.30   BNP (last 3 results)  Recent Labs  04/04/12 0010 05/07/12 2205  PROBNP 5852.0* 6356.0*     CBG: No results found for this basename: GLUCAP,  in the last 168 hours  No results found for this or any previous  visit (from the past 240 hour(s)).   Studies: Dg Chest 2 View  05/07/2012  *RADIOLOGY REPORT*  Clinical Data: Shortness of breath.  CHEST - 2 VIEW  Comparison: April 03, 2012.  Findings: Stable mild cardiomegaly and central pulmonary vascular congestion.  No pleural effusion or pneumothorax is noted.  No acute pulmonary disease is noted.  Bony thorax is intact.  IMPRESSION: No acute cardiopulmonary abnormality seen.   Original Report Authenticated By: Lupita Raider.,  M.D.     Scheduled Meds: . antiseptic oral rinse  15 mL Mouth Rinse BID  . carvedilol  25 mg Oral Daily  . enoxaparin (LOVENOX) injection  65 mg Subcutaneous Q24H  . furosemide  60 mg Intravenous Q12H  . isosorbide-hydrALAZINE  1 tablet Oral Q8H  . pneumococcal 23 valent vaccine  0.5 mL Intramuscular Tomorrow-1000  . potassium chloride  20 mEq Oral BID  . sodium chloride  3 mL Intravenous Q12H   Continuous Infusions:   Principal Problem:   Acute exacerbation of CHF (congestive heart failure) Active Problems:   HTN (hypertension)    Time spent: 40 minutes   [facility]  [facility] Pager [phone]. If 8PM-8AM, please contact night-coverage at [email or web address], password [facility] 05/09/2012, 8:11 AM  LOS: 2 days

## 2012-05-10 ENCOUNTER — Inpatient Hospital Stay (HOSPITAL_COMMUNITY): Payer: BC Managed Care – PPO

## 2012-05-10 DIAGNOSIS — I509 Heart failure, unspecified: Secondary | ICD-10-CM

## 2012-05-10 DIAGNOSIS — I10 Essential (primary) hypertension: Secondary | ICD-10-CM

## 2012-05-10 LAB — BASIC METABOLIC PANEL
GFR calc Af Amer: 64 mL/min — ABNORMAL LOW (ref >90)
GFR calc non Af Amer: 55 mL/min — ABNORMAL LOW (ref >90)
Glucose, Bld: 91 mg/dL (ref 70–99)
Potassium: 3.5 mEq/L (ref 3.5–5.1)
Sodium: 141 mEq/L (ref 135–145)

## 2012-05-10 MED ORDER — FUROSEMIDE 40 MG PO TABS: 80.0000 mg | ORAL_TABLET | Freq: Two times a day (BID) | ORAL | Status: DC

## 2012-05-10 MED ORDER — POTASSIUM CHLORIDE 20 MEQ PO PACK: 40.0000 meq | PACK | Freq: Every day | ORAL | Status: DC

## 2012-05-10 MED ORDER — CARVEDILOL 12.5 MG PO TABS: 25.0000 mg | ORAL_TABLET | Freq: Two times a day (BID) | ORAL | Status: DC

## 2012-05-10 MED ORDER — MAGNESIUM OXIDE 400 (241.3 MG) MG PO TABS
800.0000 mg | ORAL_TABLET | Freq: Once | ORAL | Status: AC
  Administered 2012-05-10: 800 mg via ORAL
  Filled 2012-05-10: qty 2

## 2012-05-10 MED ORDER — METOLAZONE 2.5 MG PO TABS
2.5000 mg | ORAL_TABLET | Freq: Once | ORAL | Status: AC
  Administered 2012-05-10: 2.5 mg via ORAL
  Filled 2012-05-10: qty 1

## 2012-05-10 MED ORDER — POTASSIUM CHLORIDE CRYS ER 20 MEQ PO TBCR
40.0000 meq | EXTENDED_RELEASE_TABLET | Freq: Once | ORAL | Status: AC
  Administered 2012-05-10: 40 meq via ORAL
  Filled 2012-05-10: qty 2

## 2012-05-10 NOTE — Discharge Summary (Addendum)
Physician Discharge Summary  Brandon Snyder MRN: [id number] DOB/AGE: January 26, 1965 47 y.o.  PCP: No primary provider on file.   Admit date: 05/07/2012 Discharge date: 05/10/2012  Discharge Diagnoses:      Acute systolic exacerbation of CHF (congestive heart failure) EF of 30-35% Active Problems:   HTN (hypertension)     Medication List    TAKE these medications       carvedilol 12.5 MG tablet  Commonly known as:  COREG  Take 2 tablets (25 mg total) by mouth 2 (two) times daily with a meal.     furosemide 40 MG tablet  Commonly known as:  LASIX  Take 2 tablets (80 mg total) by mouth 2 (two) times daily. Per pharmacy instructions patient was to take 1 tab twice daily.  Patient was taking two tablets once in the am.     isosorbide-hydrALAZINE 20-37.5 MG per tablet  Commonly known as:  BIDIL  Take 1 tablet by mouth 3 (three) times daily.     lisinopril 20 MG tablet  Commonly known as:  PRINIVIL,ZESTRIL  Take 20 mg by mouth daily.     potassium chloride 20 MEQ packet  Commonly known as:  KLOR-CON  Take 40 mEq by mouth daily.        Discharge Condition: Stable  Disposition: 01-Home or Self Care   Consults: None  Significant Diagnostic Studies: Dg Chest 2 View  05/07/2012  *RADIOLOGY REPORT*  Clinical Data: Shortness of breath.  CHEST - 2 VIEW  Comparison: April 03, 2012.  Findings: Stable mild cardiomegaly and central pulmonary vascular congestion.  No pleural effusion or pneumothorax is noted.  No acute pulmonary disease is noted.  Bony thorax is intact.  IMPRESSION: No acute cardiopulmonary abnormality seen.   Original Report Authenticated By: Lupita Raider.,  M.D.           Microbiology: No results found for this or any previous visit (from the past 240 hour(s)).   Labs: Results for orders placed during the hospital encounter of 05/07/12 (from the past 48 hour(s))  TROPONIN I     Status: None   Collection Time    05/08/12 12:00 PM      Result  Value Range   Troponin I <0.30  <0.30 ng/mL   Comment:            Due to the release kinetics of cTnI,     a negative result within the first hours     of the onset of symptoms does not rule out     myocardial infarction with certainty.     If myocardial infarction is still suspected,     repeat the test at appropriate intervals.  TROPONIN I     Status: None   Collection Time    05/08/12  5:47 PM      Result Value Range   Troponin I <0.30  <0.30 ng/mL   Comment:            Due to the release kinetics of cTnI,     a negative result within the first hours     of the onset of symptoms does not rule out     myocardial infarction with certainty.     If myocardial infarction is still suspected,     repeat the test at appropriate intervals.  BASIC METABOLIC PANEL     Status: Abnormal   Collection Time    05/09/12  5:17 AM      Result  Value Range   Sodium 140  135 - 145 mEq/L   Potassium 3.3 (*) 3.5 - 5.1 mEq/L   Chloride 101  96 - 112 mEq/L   CO2 35 (*) 19 - 32 mEq/L   Glucose, Bld 83  70 - 99 mg/dL   BUN 15  6 - 23 mg/dL   Creatinine, Ser 1.61 (*) 0.50 - 1.35 mg/dL   Calcium 8.6  8.4 - 09.6 mg/dL   GFR calc non Af Amer 55 (*) >90 mL/min   GFR calc Af Amer 63 (*) >90 mL/min   Comment:            The eGFR has been calculated     using the CKD EPI equation.     This calculation has not been     validated in all clinical     situations.     eGFR's persistently     <90 mL/min signify     possible Chronic Kidney Disease.  BASIC METABOLIC PANEL     Status: Abnormal   Collection Time    05/10/12  4:45 AM      Result Value Range   Sodium 141  135 - 145 mEq/L   Potassium 3.5  3.5 - 5.1 mEq/L   Chloride 102  96 - 112 mEq/L   CO2 33 (*) 19 - 32 mEq/L   Glucose, Bld 91  70 - 99 mg/dL   BUN 15  6 - 23 mg/dL   Creatinine, Ser 0.45 (*) 0.50 - 1.35 mg/dL   Calcium 8.8  8.4 - 40.9 mg/dL   GFR calc non Af Amer 55 (*) >90 mL/min   GFR calc Af Amer 64 (*) >90 mL/min   Comment:             The eGFR has been calculated     using the CKD EPI equation.     This calculation has not been     validated in all clinical     situations.     eGFR's persistently     <90 mL/min signify     possible Chronic Kidney Disease.  MAGNESIUM     Status: None   Collection Time    05/10/12  4:45 AM      Result Value Range   Magnesium 1.8  1.5 - 2.5 mg/dL     HPI : Brandon Snyder is a 47 y.o. male with PMH of CHF, who was at work today when he began to develop onset of SOB, DOE. EMS was called and the patient was noted to have BP of 200s/110s. He states he took his medication as prescribed which includes lisinopril 20mg  once a day and coreg 6.25mg  "2 pills once a day". 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 He also describes orthopnea, non-productive cough.  In the ED he was treated with lasix, NTG ointment to get his BP down and transferred to [facility] for admission.     HOSPITAL COURSE:  #1Acute exacerbation of CHF  EF of 30-35% Patient was diuresed with Lasix 60 IV twice a day Now transitioned to 80 mg twice a day He was found to have some nonsustained ventricular tachycardia Coreg was increased to 25 mg twice a day The patient will continue on his lisinopril Despite aggressive diuresis the patient's creatinine remained stable around 1.5 which is his baseline He will followup with Dr. Jacinto Halim in one week May need a cath outpt    Discharge  Exam: * Blood pressure 154/102, pulse 67, temperature 98.4 F (36.9 C), temperature source Oral, resp. rate 20, height 6\' 2"  (1.88 m), weight 132.042 kg (291 lb 1.6 oz), SpO2 97.00%.  General: NAD, resting comfortably in bed  Eyes: PEERLA EOMI  ENT: mucous membranes moist  Neck: supple w/o JVD  Cardiovascular: RRR w/o MRG  Respiratory: CTA B  Abdomen: soft, nt, nd, bs+  Skin: no rash nor lesion  Musculoskeletal: MAE, full ROM all 4 extremities  Psychiatric: normal tone and affect  Neurologic: AAOx3, grossly  non-focal        Signed: Samaria Anes 05/10/2012, 8:09 AM

## 2012-05-10 NOTE — Progress Notes (Signed)
05/10/12 1015 In to completed Heart Failure Home Health Screen.  At this time the pt. does not want home health sevices, and the pt. states he will be going back to work, therefore he would not be homebound.  Pt. does need assistance with his medications.  Pt. states that his insurance thru his work will start at the end of May.  Pt. is eligible for the [facility] program for assistance with medications for $3 co-pay/each medication.  Explained to pt. that this assistance would be once a year, he would have to use the card within 7days of dc, and he would have to use the specific pharmacy listed on the [facility] card.  Pt. to possibly dc today.  Tera Mater, RN, BSN NCM [phone]

## 2012-05-11 LAB — BASIC METABOLIC PANEL
BUN: 16 mg/dL (ref 6–23)
CO2: 36 mEq/L — ABNORMAL HIGH (ref 19–32)
Chloride: 97 mEq/L (ref 96–112)
Glucose, Bld: 85 mg/dL (ref 70–99)
Potassium: 3.1 mEq/L — ABNORMAL LOW (ref 3.5–5.1)

## 2012-05-11 MED ORDER — POTASSIUM CHLORIDE CRYS ER 20 MEQ PO TBCR
80.0000 meq | EXTENDED_RELEASE_TABLET | Freq: Once | ORAL | Status: AC
  Administered 2012-05-11: 80 meq via ORAL
  Filled 2012-05-11: qty 4

## 2012-05-11 NOTE — Progress Notes (Signed)
Physician Discharge Summary  Brandon Snyder  MRN: [id number]  DOB/AGE: 1966/01/23 47 y.o.  PCP: No primary provider on file.  Admit date: 05/07/2012  Discharge date: 05/11/2012    Brandon D. stage III   Discharge Diagnoses:  Acute systolic exacerbation of CHF (congestive heart failure) EF of 30-35%  Active Problems:  HTN (hypertension)    Medication List     TAKE these medications       carvedilol 12.5 MG tablet    Commonly known as: COREG    Take 2 tablets (25 mg total) by mouth 2 (two) times daily with a meal.    furosemide 40 MG tablet    Commonly known as: LASIX    Take 2 tablets (80 mg total) by mouth 2 (two) times daily. Per pharmacy instructions patient was to take 1 tab twice daily. Patient was taking two tablets once in the am.    isosorbide-hydrALAZINE 20-37.5 MG per tablet    Commonly known as: BIDIL    Take 1 tablet by mouth 3 (three) times daily.    lisinopril 20 MG tablet    Commonly known as: PRINIVIL,ZESTRIL    Take 20 mg by mouth daily.    potassium chloride 20 MEQ packet    Commonly known as: KLOR-CON    Take 40 mEq by mouth daily.      Discharge Condition: Stable  Disposition: 01-Home or Self Care  Consults: None  Significant Diagnostic Studies:      Dg Chest 2 View  05/07/2012 *RADIOLOGY REPORT* Clinical Data: Shortness of breath. CHEST - 2 VIEW Comparison: April 03, 2012. Findings: Stable mild cardiomegaly and central pulmonary vascular congestion. No pleural effusion or pneumothorax is noted. No acute pulmonary disease is noted. Bony thorax is intact. IMPRESSION: No acute cardiopulmonary abnormality seen. Original Report Authenticated By: Lupita Raider., M.D.  Microbiology:  No results found for this or any previous visit (from the past 240 hour(s)).  Labs:  Results for orders placed during the hospital encounter of 05/07/12 (from the past 48 hour(s))   TROPONIN I Status: None    Collection Time    05/08/12 12:00 PM   Result  Value  Range    Troponin I  <0.30  <0.30 ng/mL    Comment:      Due to the release kinetics of cTnI,     a negative result within the first hours     of the onset of symptoms does not rule out     myocardial infarction with certainty.     If myocardial infarction is still suspected,     repeat the test at appropriate intervals.   TROPONIN I Status: None    Collection Time    05/08/12 5:47 PM   Result  Value  Range    Troponin I  <0.30  <0.30 ng/mL    Comment:      Due to the release kinetics of cTnI,     a negative result within the first hours     of the onset of symptoms does not rule out     myocardial infarction with certainty.     If myocardial infarction is still suspected,     repeat the test at appropriate intervals.   BASIC METABOLIC PANEL Status: Abnormal    Collection Time    05/09/12 5:17 AM   Result  Value  Range    Sodium  140  135 - 145 mEq/L    Potassium  3.3 (*)  3.5 -  5.1 mEq/L    Chloride  101  96 - 112 mEq/L    CO2  35 (*)  19 - 32 mEq/L    Glucose, Bld  83  70 - 99 mg/dL    BUN  15  6 - 23 mg/dL    Creatinine, Ser  1.61 (*)  0.50 - 1.35 mg/dL    Calcium  8.6  8.4 - 10.5 mg/dL    GFR calc non Af Amer  55 (*)  >90 mL/min    GFR calc Af Amer  63 (*)  >90 mL/min    Comment:      The eGFR has been calculated     using the CKD EPI equation.     This calculation has not been     validated in all clinical     situations.     eGFR's persistently     <90 mL/min signify     possible Chronic Kidney Disease.   BASIC METABOLIC PANEL Status: Abnormal    Collection Time    05/10/12 4:45 AM   Result  Value  Range    Sodium  141  135 - 145 mEq/L    Potassium  3.5  3.5 - 5.1 mEq/L    Chloride  102  96 - 112 mEq/L    CO2  33 (*)  19 - 32 mEq/L    Glucose, Bld  91  70 - 99 mg/dL    BUN  15  6 - 23 mg/dL    Creatinine, Ser  0.96 (*)  0.50 - 1.35 mg/dL    Calcium  8.8  8.4 - 10.5 mg/dL    GFR calc non Af Amer  55 (*)  >90 mL/min    GFR calc Af Amer  64 (*)  >90 mL/min     Comment:      The eGFR has been calculated     using the CKD EPI equation.     This calculation has not been     validated in all clinical     situations.     eGFR's persistently     <90 mL/min signify     possible Chronic Kidney Disease.   MAGNESIUM Status: None    Collection Time    05/10/12 4:45 AM   Result  Value  Range    Magnesium  1.8  1.5 - 2.5 mg/dL    HPI :  Brandon Snyder is a 47 y.o. male with PMH of CHF, who was at work today when he began to develop onset of SOB, DOE. EMS was called and the patient was noted to have BP of 200s/110s. He states he took his medication as prescribed which includes lisinopril 20mg  once a day and coreg 6.25mg  "2 pills once a day". 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 He also describes orthopnea, non-productive cough.  In the ED he was treated with lasix, NTG ointment to get his BP down and transferred to [facility] for admission.    HOSPITAL COURSE:  #1Acute exacerbation of CHF  EF of 30-35%  Patient was diuresed with Lasix 60 IV twice a day  Now transitioned to Lasix 80 mg twice a day  He was found to have some nonsustained ventricular tachycardia  Coreg was increased to 25 mg twice a day  The patient will continue on his lisinopril  Despite aggressive diuresis the patient's creatinine remained stable around 1.5 which is his baseline  He  will followup with Dr. Jacinto Halim in one week , I discussed with Dr. Jacinto Halim, prior to his discharge Needed repeat BMP in one week because of recurrent hypokalemia May need a cath outpt    Discharge Exam: *  Blood pressure 154/102, pulse 67, temperature 98.4 F (36.9 C), temperature source Oral, resp. rate 20, height 6\' 2"  (1.88 m), weight 132.042 kg (291 lb 1.6 oz), SpO2 97.00%.  General: NAD, resting comfortably in bed  Eyes: PEERLA EOMI  ENT: mucous membranes moist  Neck: supple w/o JVD  Cardiovascular: RRR w/o MRG  Respiratory: CTA B  Abdomen: soft, nt, nd, bs+  Skin: no  rash nor lesion  Musculoskeletal: MAE, full ROM all 4 extremities  Psychiatric: normal tone and affect  Neurologic: AAOx3, grossly non-focal

## 2013-01-03 ENCOUNTER — Ambulatory Visit (INDEPENDENT_AMBULATORY_CARE_PROVIDER_SITE_OTHER): Payer: 59 | Admitting: Family Medicine

## 2013-01-03 ENCOUNTER — Encounter: Payer: Self-pay | Admitting: Family Medicine

## 2013-01-03 ENCOUNTER — Ambulatory Visit: Payer: 59

## 2013-01-03 VITALS — BP 200/136 | HR 76 | Temp 98.5°F | Resp 18 | Ht 71.0 in | Wt 294.0 lb

## 2013-01-03 DIAGNOSIS — J029 Acute pharyngitis, unspecified: Secondary | ICD-10-CM

## 2013-01-03 DIAGNOSIS — Z8679 Personal history of other diseases of the circulatory system: Secondary | ICD-10-CM

## 2013-01-03 DIAGNOSIS — I10 Essential (primary) hypertension: Secondary | ICD-10-CM

## 2013-01-03 LAB — POCT RAPID STREP A (OFFICE): Rapid Strep A Screen: NEGATIVE

## 2013-01-03 NOTE — Patient Instructions (Addendum)
You can use plain muxinex and any products geared towards people with high blood pressure.  Please go ahead and take your BP meds when you get home.  If you develop any chest pains, headache, etc please let us know right away.    Let me know if you are not feeling better soon!  Follow-up for a BP check in about one week

## 2013-01-03 NOTE — Progress Notes (Signed)
[facility] [address] [place] [address] [phone]  Date:  01/03/2013   Name:  Brandon Snyder   DOB:  11/28/1965   MRN:  [id number]  PCP:  No primary provider on file.    Chief Complaint: Sore Throat, Cough, Generalized Body Aches and Hypertension   History of Present Illness:  Brandon Snyder is a 47 y.o. very pleasant male patient who presents with the following:  He is here today with cold/ flu symptoms.  He has noted them for a couple of days.  He has noted a ST, runny nose, body aches.  He has not noted any fever.  No cough.  He wants to be sure he does not have the flu No sinus pain and pressure.  He has not noted a HA.    He has a history of severe HTN and CHF.  He forgot his meds when he went out of town recently.  He got back in town yesterday but has not taken his meds yet today.  He has been out of his HTN meds for about 4 days not total  Dr. Jacinto Halim is his cardiologist He was admitted in April of this year with acute CHF exacerbation.  He was treated with lasix and has done well since He does not noted any CP or SOB.  He does not have orthopnea. No pedal edema. No PND.    Patient Active Problem List   Diagnosis Date Noted  . Acute exacerbation of CHF (congestive heart failure) 05/08/2012  . HTN (hypertension) 05/08/2012    Past Medical History  Diagnosis Date  . Hypertension   . Asthma   . Pneumonia   . CHF (congestive heart failure)     No past surgical history on file.  History  Substance Use Topics  . Smoking status: Never Smoker   . Smokeless tobacco: Not on file  . Alcohol Use: Yes     Comment: occasional    Family History  Problem Relation Age of Onset  . Cancer Mother     No Known Allergies  Medication list has been reviewed and updated.  Current Outpatient Prescriptions on File Prior to Visit  Medication Sig Dispense Refill  . carvedilol (COREG) 12.5 MG tablet Take 2 tablets (25 mg total) by mouth 2 (two)  times daily with a meal.  90 tablet  2  . furosemide (LASIX) 40 MG tablet Take 2 tablets (80 mg total) by mouth 2 (two) times daily. Per pharmacy instructions patient was to take 1 tab twice daily.  Patient was taking two tablets once in the am.  120 tablet  4  . isosorbide-hydrALAZINE (BIDIL) 20-37.5 MG per tablet Take 1 tablet by mouth 3 (three) times daily.      . potassium chloride (KLOR-CON) 20 MEQ packet Take 40 mEq by mouth daily.  120 tablet  6   No current facility-administered medications on file prior to visit.    Review of Systems:  As per HPI- otherwise negative.   Physical Examination: Filed Vitals:   01/03/13 1712  BP: 200/136  Pulse:   Temp:   Resp:    Filed Vitals:   01/03/13 1654  Height: 5\' 11"  (1.803 m)  Weight: 294 lb (133.358 kg)   Body mass index is 41.02 kg/(m^2). Ideal Body Weight: Weight in (lb) to have BMI = 25: 178.9  GEN: WDWN, NAD, Non-toxic, A & O x 3, obese, looks well HEENT: Atraumatic, Normocephalic. Neck supple. No  masses, No LAD.  Bilateral TM wnl, oropharynx normal.  PEERL,EOMI.   Ears and Nose: No external deformity. CV: RRR, No M/G/R. No JVD. No thrill. No extra heart sounds. PULM: CTA B, no wheezes, crackles, rhonchi. No retractions. No resp. distress. No accessory muscle use. ABD: S, NT, ND EXTR: No c/c/e NEURO Normal gait.  PSYCH: Normally interactive. Conversant. Not depressed or anxious appearing.  Calm demeanor.   Results for orders placed in visit on 01/03/13  POCT INFLUENZA A/B      Result Value Range   Influenza A, POC Negative     Influenza B, POC Negative    POCT RAPID STREP A (OFFICE)      Result Value Range   Rapid Strep A Screen Negative  Negative    [facility] reading (PRIMARY) by  Dr. Patsy Lager. CXR: negative.  No effusion CHEST 2 VIEW  COMPARISON: 05/10/2012  FINDINGS: The heart is mildly enlarged. The lungs are clear bilaterally. No sizable effusion or pneumothorax is noted. The bony structures are within  normal limits.  IMPRESSION: No acute abnormality noted.  Assessment and Plan: Acute pharyngitis - Plan: POCT Influenza A/B  Essential hypertension, malignant  History of CHF (congestive heart failure) - Plan: DG Chest 2 View  Likely viral URI . Went over medications that he can safely take given his HTN.   Start back on BP meds today!!  See patient instructions for more details.     Signed Abbe Amsterdam, MD

## 2013-01-10 ENCOUNTER — Ambulatory Visit (INDEPENDENT_AMBULATORY_CARE_PROVIDER_SITE_OTHER): Payer: 59 | Admitting: Emergency Medicine

## 2013-01-10 ENCOUNTER — Ambulatory Visit: Payer: 59

## 2013-01-10 VITALS — BP 128/76 | HR 73 | Temp 98.3°F | Resp 17 | Ht 74.5 in | Wt 298.0 lb

## 2013-01-10 DIAGNOSIS — M79672 Pain in left foot: Secondary | ICD-10-CM

## 2013-01-10 DIAGNOSIS — M79609 Pain in unspecified limb: Secondary | ICD-10-CM

## 2013-01-10 NOTE — Progress Notes (Signed)
   Subjective:    Patient ID: Brandon Snyder, male    DOB: [birth year], 47 y.o.   MRN: [id number]  HPI   Patient presents today with left foot pain. He does not recall doing anything that could injure his foot. He went to the Gym and did his normal routine at the gym. He went to bed last night and he felt a pop in his foot. This morning he had a hard time putting pressure on his foot. The pain is in the side of his foot.     Review of Systems     Objective:   Physical Exam there is significant tenderness over the left fourth and fifth metatarsals. There is no tenderness over the ankle the Achilles is intact. There is no discoloration to the toes. Sows pedis posterior tibial pulses are 2+ to  [facility] reading (PRIMARY) by  Dr.Daub there appears to be a questionable cortical defect at the tip of the fourth metatarsal.        Assessment & Plan:  I suspect this is a stress fracture of the fourth metatarsal. We'll put in a short cam boot he will take 2 Aleve twice a day repeat x-ray in 2 weeks.

## 2013-01-10 NOTE — Patient Instructions (Signed)
Please return to clinic in 2 weeks for a repeat Xray                                Stress Fracture When too much stress is put on the foot, as may occur in running and jumping sports, the lengthy shafts of the bones of the forefoot become susceptible to breaking due to repetitive stress (stress fracture) because of thinness of these bone. A stress fracture is more common if osteoporosis is present or if inadequate athletic footwear is used. Shoes should be used which adequately support the sole of the foot to absorb the shocks of the activity participated in. Stress fractures are very common in competitive male runners who develop these small cracks on the surface of the bones in their legs and feet. The women most likely to suffer these injuries are those who restrict food intake and those who have irregular periods. Stress fractures usually start out as a minor discomfort in the foot or leg. The completion of fracture due to repetitive loading often occurs near the end of a long run. The pain may dissipate with rest. With the next exercise session, the pain may return earlier in the run. If an athlete notices that it hurts to touch just one spot on a bone and then stops running for a week, he or she may be tempted to return to running too soon. Often the pain is ignored in order to continue with high impact exercise. A stress fracture then develops. The athlete now has to avoid the hard pounding of running, but can ride a bike or swim for exercise once the pain has resolved with normal weight bearing until the fracture heals in 6 12 weeks. The most common sites for stress fractures are the bones in the front of the feet (metatarsals) and the long bone of the lower leg (tibia), but running can cause stress fractures anywhere in the lower extremities or pelvis. DIAGNOSIS  Usually the diagnosis is made by reviewing the patient's history. The bone involved progressively becomes more painful with activities.  X-rays may show no break within the first 2 3 weeks that pain begins. A later X-ray may show signs that the bone is healing. Having a bone scan or MRI usually makes an earlier diagnosis possible. HOME CARE INSTRUCTIONS  Treatment may include a cast or walking shoe.  High impact activities should be stopped until advised by your caregiver.  Wear shoes with adequate shock absorbing abilities and good support of the sole of the foot. This is especially important in the arch of the foot.  Alternative exercise may be undertaken while waiting for healing. This may include bicycling and swimming. If you do not have a cast or splint:  You may walk on your injured foot as tolerated or advised.  Do not put any weight on your injured foot until instructed. Slowly increase the amount of time you walk on the foot as the pain allows or as advised.  Use crutches until you can bear weight without pain. A gradual increase in weight bearing may help.  Apply ice to the injured area for the first 2 days after you have been treated or as directed by your caregiver.  Put ice in a plastic bag.  Place a towel between your skin and the bag.  Leave the ice on for 15 20 minutes at a time, every hour while you are awake.  Only take over-the-counter or prescription medicines for pain or discomfort as directed by your caregiver.  If your caregiver has given you a follow-up appointment, it is very important to keep that appointment. Not keeping the appointment could result in a chronic or permanent injury, pain, and disability. SEEK IMMEDIATE MEDICAL CARE IF:   Pain is becoming worse rather than better.  Pain is uncontrolled with medicine.  You have increased swelling or redness in the foot.  The feeling in the foot or leg is diminished. MAKE SURE YOU:   Understand these instructions.  Will watch your condition.  Will get help right away if you are not doing well or get worse. Document Released:  04/01/2002 Document Revised: 05/06/2012 Document Reviewed: 08/26/2007 [facility] Patient Information 2014 [place], [place].  repeat x-ray

## 2013-01-31 ENCOUNTER — Ambulatory Visit: Payer: 59

## 2013-01-31 ENCOUNTER — Ambulatory Visit: Payer: 59 | Admitting: Emergency Medicine

## 2013-01-31 VITALS — BP 140/100 | HR 61 | Temp 99.3°F | Resp 16 | Ht 74.5 in | Wt 298.0 lb

## 2013-01-31 DIAGNOSIS — M79672 Pain in left foot: Secondary | ICD-10-CM

## 2013-01-31 DIAGNOSIS — M79609 Pain in unspecified limb: Secondary | ICD-10-CM

## 2013-01-31 NOTE — Progress Notes (Deleted)
   Subjective:    Patient ID: Brandon Snyder, male    DOB: 09-13-1965, 48 y.o.   MRN: [id number]  HPI    Review of Systems     Objective:   Physical Exam        Assessment & Plan:

## 2013-01-31 NOTE — Progress Notes (Addendum)
Subjective:    Patient ID: Brandon Snyder, male    DOB: Jan 18, 1966, 48 y.o.   MRN: [id number]  HPI This chart was scribed for Viviann SpareSteven Daub-MD, by Ladona Ridgelaylor Day, Scribe. This patient was seen in room 10 and the patient's care was started at 3:12 PM.  HPI Comments: Brandon Snyder is a 48 y.o. male who [facility] complaining for recheck of a left foot injury for which he was seen here 12/19 after he felt a pop from his left foot while at rest at home and felt pain w/bearing weight. Since then he states his pain has mildly improved, he has been wearing a boot to keep pressure off of his foot. He denies any other injuries.  Patient Active Problem List   Diagnosis Date Noted  . Acute exacerbation of CHF (congestive heart failure) 05/08/2012  . HTN (hypertension) 05/08/2012    No past surgical history on file.  Family History  Problem Relation Age of Onset  . Cancer Mother     History   Social History  . Marital Status: Married    Spouse Name: N/A    Number of Children: N/A  . Years of Education: N/A   Occupational History  . Not on file.   Social History Main Topics  . Smoking status: Never Smoker   . Smokeless tobacco: Not on file  . Alcohol Use: Yes     Comment: occasional  . Drug Use: No  . Sexual Activity: Not on file   Other Topics Concern  . Not on file   Social History Narrative  . No narrative on file    No Known Allergies  Results for orders placed in visit on 01/03/13  POCT INFLUENZA A/B      Result Value Range   Influenza A, POC Negative     Influenza B, POC Negative    POCT RAPID STREP A (OFFICE)      Result Value Range   Rapid Strep A Screen Negative  Negative   Review of Systems  Constitutional: Negative for chills.  Respiratory: Negative for choking.   Cardiovascular: Negative for chest pain.  Musculoskeletal: Negative for back pain.       Left foot pain, lateral forefoot       Objective:   Physical  Exam Physical Exam  Nursing note and vitals reviewed. Constitutional: Patient is oriented to person, place, and time. Patient appears well-developed and well-nourished. No distress.  HENT:  Head: Normocephalic and atraumatic.  Neck: Neck supple. No tracheal deviation present.  Cardiovascular: Normal rate, regular rhythm and normal heart sounds.   No murmur heard. Pulmonary/Chest: Effort normal and breath sounds normal. No respiratory distress. Patient has no wheezes. Patient has no rales.  Musculoskeletal: Normal range of motion. There is tenderness which is minimal over the base of the great toe. There is no swelling noted. There are no specific areas of tenderness over the metatarsals the  Neurological: Patient is alert and oriented to person, place, and time.  Skin: Skin is warm and dry.  Psychiatric: Patient has a normal mood and affect. Patient's behavior is normal.   Triage Vitals: BP 140/100  Pulse 61  Temp(Src) 99.3 F (37.4 C) (Oral)  Resp 16  Ht 6' 2.5" (1.892 m)  Wt 298 lb (135.172 kg)  BMI 37.76 kg/m2  SpO2 100%  DIAGNOSTIC STUDIES: Oxygen Saturation is 100% on room air, normal by my interpretation.   [facility] reading (PRIMARY) by  Dr.Daub no fracture is seen   COORDINATION OF CARE: At 300 PM Discussed treatment plan with patient which includes left foot X-ray. Patient agrees.      Assessment & Plan:  What kind transition to a regular shoe. He is to continue his blood pressure medications use his other issue is needed.

## 2013-02-21 ENCOUNTER — Ambulatory Visit: Payer: 59 | Admitting: Family Medicine

## 2013-02-21 VITALS — BP 172/102 | HR 88 | Temp 98.8°F | Resp 16 | Ht 73.0 in | Wt 302.0 lb

## 2013-02-21 DIAGNOSIS — Z8679 Personal history of other diseases of the circulatory system: Secondary | ICD-10-CM

## 2013-02-21 DIAGNOSIS — M79609 Pain in unspecified limb: Secondary | ICD-10-CM

## 2013-02-21 DIAGNOSIS — I10 Essential (primary) hypertension: Secondary | ICD-10-CM

## 2013-02-21 DIAGNOSIS — M79672 Pain in left foot: Secondary | ICD-10-CM

## 2013-02-21 LAB — COMPREHENSIVE METABOLIC PANEL
ALT: 23 U/L (ref 0–53)
CO2: 31 mEq/L (ref 19–32)
Calcium: 8.9 mg/dL (ref 8.4–10.5)
Chloride: 102 mEq/L (ref 96–112)
Potassium: 3.9 mEq/L (ref 3.5–5.3)
Sodium: 141 mEq/L (ref 135–145)
Total Protein: 6.8 g/dL (ref 6.0–8.3)

## 2013-02-21 LAB — COMPREHENSIVE METABOLIC PANEL WITH GFR
AST: 20 U/L (ref 0–37)
Albumin: 3.7 g/dL (ref 3.5–5.2)
Alkaline Phosphatase: 74 U/L (ref 39–117)
BUN: 14 mg/dL (ref 6–23)
Creat: 1.31 mg/dL (ref 0.50–1.35)
Glucose, Bld: 95 mg/dL (ref 70–99)
Total Bilirubin: 0.4 mg/dL (ref 0.2–1.2)

## 2013-02-21 MED ORDER — ISOSORB DINITRATE-HYDRALAZINE 20-37.5 MG PO TABS: 1.0000 | ORAL_TABLET | Freq: Three times a day (TID) | ORAL | Status: AC

## 2013-02-21 MED ORDER — POTASSIUM CHLORIDE 20 MEQ PO PACK: 40.0000 meq | PACK | Freq: Every day | ORAL | Status: AC

## 2013-02-21 MED ORDER — FUROSEMIDE 40 MG PO TABS: 40.0000 mg | ORAL_TABLET | Freq: Two times a day (BID) | ORAL | Status: DC

## 2013-02-21 MED ORDER — FUROSEMIDE 40 MG PO TABS: 40.0000 mg | ORAL_TABLET | Freq: Two times a day (BID) | ORAL | Status: AC

## 2013-02-21 MED ORDER — CARVEDILOL 12.5 MG PO TABS: 25.0000 mg | ORAL_TABLET | Freq: Two times a day (BID) | ORAL | Status: DC

## 2013-02-21 NOTE — Progress Notes (Signed)
Chief Complaint:  Chief Complaint  Patient presents with  . Follow-up    To be released, left foot fracture    HPI: Brandon Snyder is a 48 y.o. male who is here for : 1. Here for recheck of his left foot, has been walking and doing all things like he used to, he is  In normal shoe for 1 week and a half. 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 He is able to return to work without problems.  2. HTN-he has been out of his meds. He has been out of his meds for 2 weeks. When he is on meds he does not have any SEs. Cardiologist is Dr Jacinto HalimGanji. He states when he is taking BP meds his bp is 120s/70s-80s.   Last echo was done due to hospitalization for PNA and CHF in April 2014.   Study Conclusions  - Left ventricle: The cavity size was moderately dilated. Systolic function was moderately to severely reduced. The estimated ejection fraction was in the range of 30% to 35%. Wall motion was normal; there were no regional wall motion abnormalities. There was a reduced contribution of atrial contraction to ventricular filling, due to increased ventricular diastolic pressure or atrial contractile dysfunction. - Aortic valve: Mild regurgitation. - Left atrium: The atrium was moderately dilated. - Right atrium: The atrium was mildly dilated. - Atrial septum: No defect or patent foramen ovale was identified. Transthoracic echocardiography.   Past Medical History  Diagnosis Date  . Hypertension   . Asthma   . Pneumonia   . CHF (congestive heart failure)    History reviewed. No pertinent past surgical history. History   Social History  . Marital Status: Married    Spouse Name: N/A    Number of Children: N/A  . Years of Education: N/A   Social History Main Topics  . Smoking status: Never Smoker   . Smokeless tobacco: None  . Alcohol Use: Yes     Comment: occasional  . Drug Use: No  . Sexual Activity: None   Other Topics Concern  . None   Social History Narrative  . None     Family History  Problem Relation Age of Onset  . Cancer Mother    No Known Allergies Prior to Admission medications   Medication Sig Start Date End Date Taking? Authorizing Provider  carvedilol (COREG) 12.5 MG tablet Take 2 tablets (25 mg total) by mouth 2 (two) times daily with a meal. 05/10/12  Yes Richarda OverlieNayana Abrol, MD  furosemide (LASIX) 40 MG tablet Take 2 tablets (80 mg total) by mouth 2 (two) times daily. Per pharmacy instructions patient was to take 1 tab twice daily.  Patient was taking two tablets once in the am. 05/10/12  Yes Richarda OverlieNayana Abrol, MD  isosorbide-hydrALAZINE (BIDIL) 20-37.5 MG per tablet Take 1 tablet by mouth 3 (three) times daily.   Yes Historical Provider, MD  potassium chloride (KLOR-CON) 20 MEQ packet Take 40 mEq by mouth daily. 05/10/12  Yes Richarda OverlieNayana Abrol, MD     ROS: The patient denies fevers, chills, night sweats, unintentional weight loss, chest pain, palpitations, wheezing, dyspnea on exertion, nausea, vomiting, abdominal pain, dysuria, hematuria, melena, numbness, weakness, or tingling.   All other systems have been reviewed and were otherwise negative with the exception of those mentioned in the HPI and as above.    PHYSICAL EXAM: Filed Vitals:   02/21/13 1536  BP: 172/102  Pulse: 88  Temp: 98.8 F (37.1 C)  Resp: 16   Filed Vitals:   02/21/13 1536  Height: 6\' 1"  (1.854 m)  Weight: 302 lb (136.986 kg)   Body mass index is 39.85 kg/(m^2).  General: Alert, no acute distress HEENT:  Normocephalic, atraumatic, oropharynx patent. EOMI, PERRLA Cardiovascular:  Regular rate and rhythm, no rubs murmurs or gallops.  No Carotid bruits, radial pulse intact. No pedal edema.  Respiratory: Clear to auscultation bilaterally.  No wheezes, rales, or rhonchi.  No cyanosis, no use of accessory musculature GI: No organomegaly, abdomen is soft and non-tender, positive bowel sounds.  No masses. Skin: No rashes. Neurologic: Facial musculature symmetric. Psychiatric:  Patient is appropriate throughout our interaction. Lymphatic: No cervical lymphadenopathy Musculoskeletal: Gait intact. Right foot exam-normal --5/5 strength, sensation intact, + DP, poster tib artery,  2/2 DTRS ankle, nontender   LABS: Results for orders placed in visit on 01/03/13  POCT INFLUENZA A/B      Result Value Range   Influenza A, POC Negative     Influenza B, POC Negative    POCT RAPID STREP A (OFFICE)      Result Value Range   Rapid Strep A Screen Negative  Negative     EKG/XRAY:   Primary read interpreted by Dr. Conley Rolls at [facility].   ASSESSMENT/PLAN: Encounter Diagnoses  Name Primary?  Marland Kitchen Foot pain, left Yes  . HTN (hypertension)   . History of CHF (congestive heart failure)    Release to return to work  Monitor for CHF sxs ie weight gain, SOB, pedal edema, monitor BP goal is defintiley less than 140/90--I will call him in 3-5 days to see how his BP is doing once back on meds.  Low sodium DASH diet  F/u  Sooner if your BP not controlled, BP parameters given , 3 months with Dr Drue Dun sideeffects, risk and benefits, and alternatives of medications d/w patient. Patient is aware that all medications have potential sideeffects and we are unable to predict every sideeffect or drug-drug interaction that may occur.  Hamilton Capri PHUONG, DO 02/21/2013 5:57 PM

## 2013-02-22 LAB — MICROALBUMIN, URINE: Microalb, Ur: 2.32 mg/dL — ABNORMAL HIGH (ref 0.00–1.89)

## 2013-02-25 ENCOUNTER — Telehealth: Payer: Self-pay | Admitting: Family Medicine

## 2013-02-25 NOTE — Telephone Encounter (Signed)
Spoke with patient about labs and also need for referral to kidney specialist if his kidney function does not improve. His BP since he restarted meds have been averaging 120s/80s.

## 2013-06-15 IMAGING — CR DG CHEST 2V
2 series · 2 of 2 positions shown · non-contrast
Comparison: 05/08/2038

CLINICAL DATA: Cough and congestion

CHEST - 2 VIEW

[w chest pa]
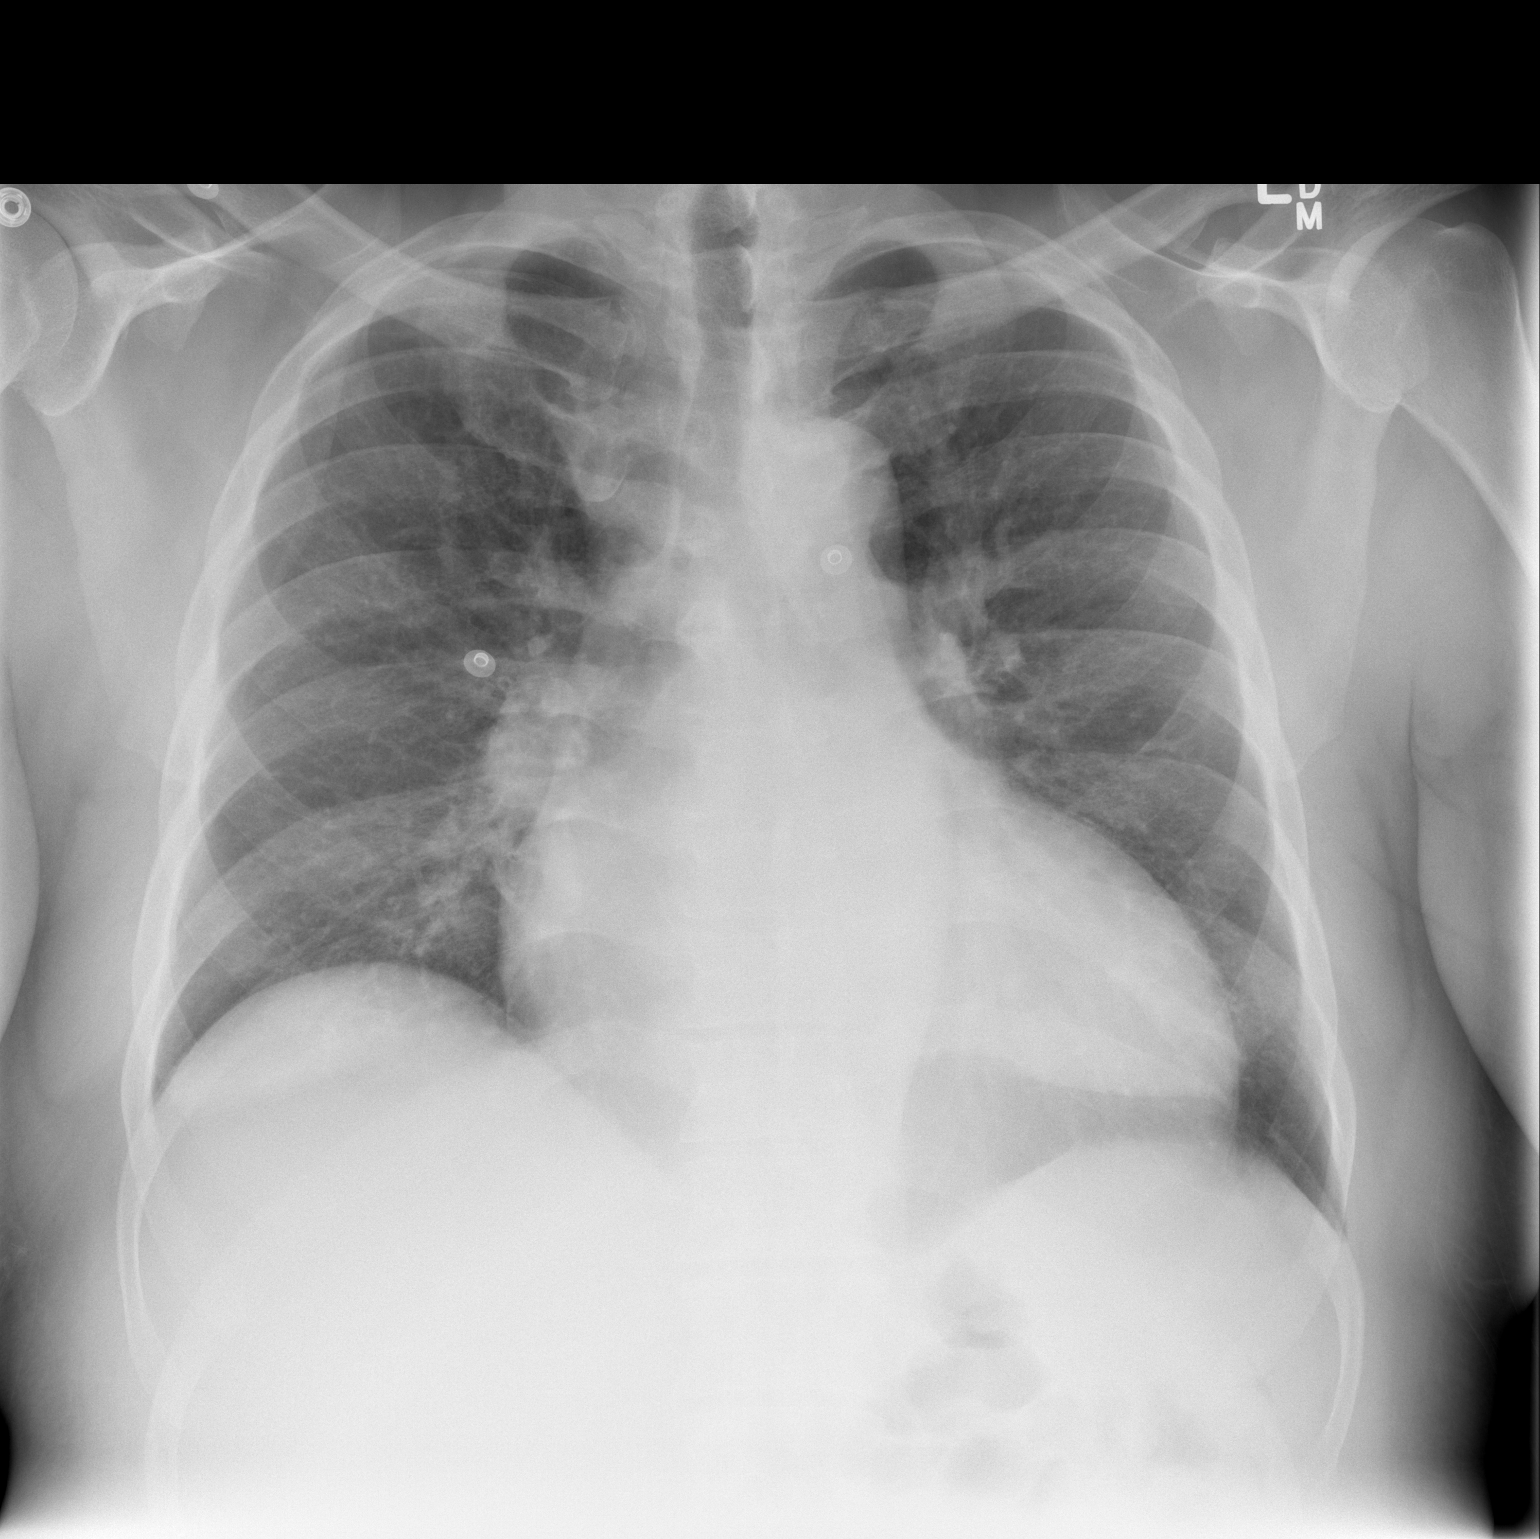

[w chest lat]
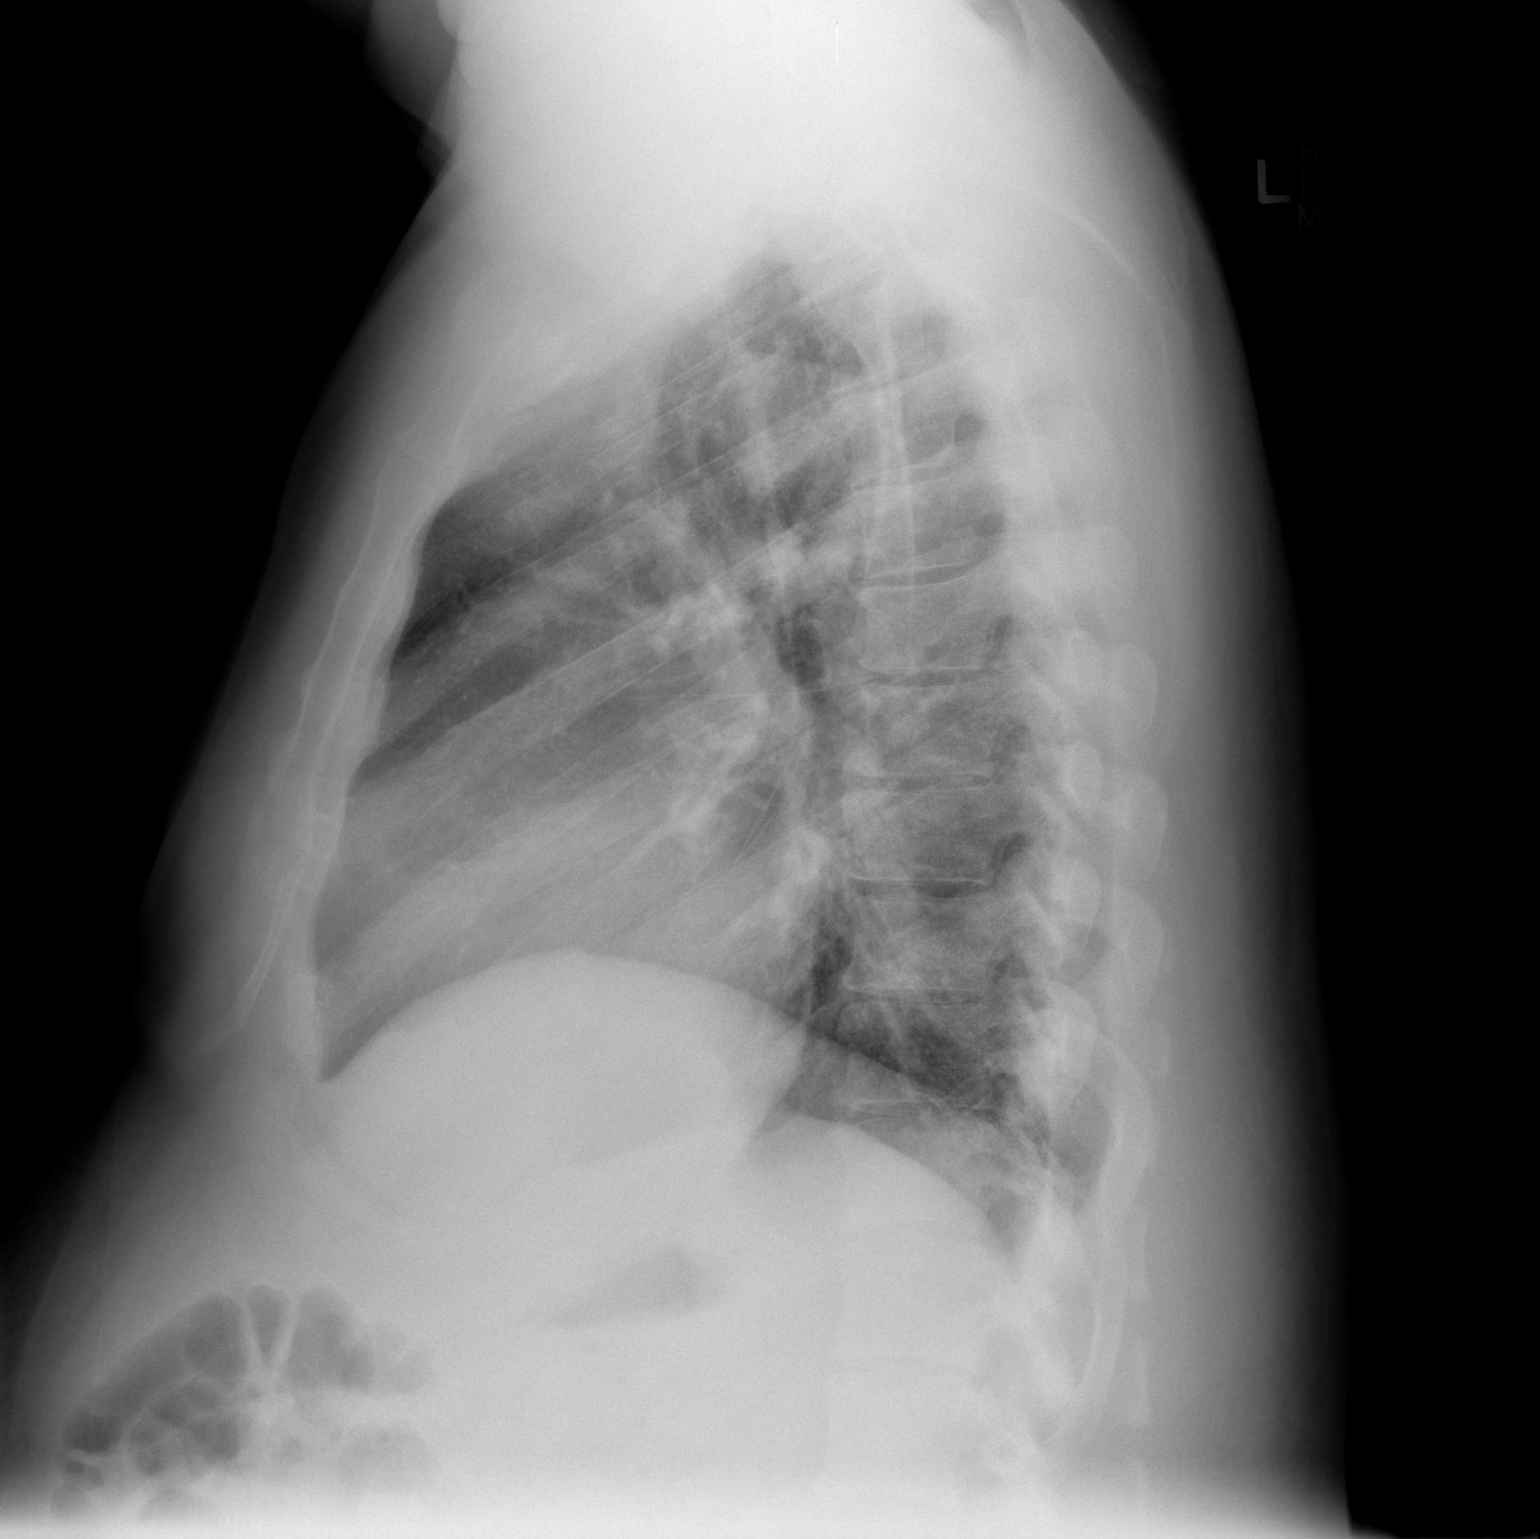

[2 of 2 positions shown; findings below may reference images not displayed]

FINDINGS: Cardiomegaly is again identified and appear stable in
degree.  An unchanged mediastinal contour is noted.  The lung
fields are clear with no signs of new focal infiltrate or
congestive failure.  No pleural fluid or significant peribronchial
cuffing is seen.

Bony structures appear stable.
IMPRESSION: Unchanged cardiomegaly with no new focal or acute abnormality
identified.

## 2013-12-22 ENCOUNTER — Other Ambulatory Visit: Payer: Self-pay | Admitting: Family Medicine

## 2014-03-08 IMAGING — CR DG FOOT COMPLETE 3+V*L*
3 series · 3 of 3 positions shown · non-contrast
Comparison: None

CLINICAL DATA: 47-year-old male with foot pain.

EXAM:
LEFT FOOT - COMPLETE 3+ VIEW

[AP]
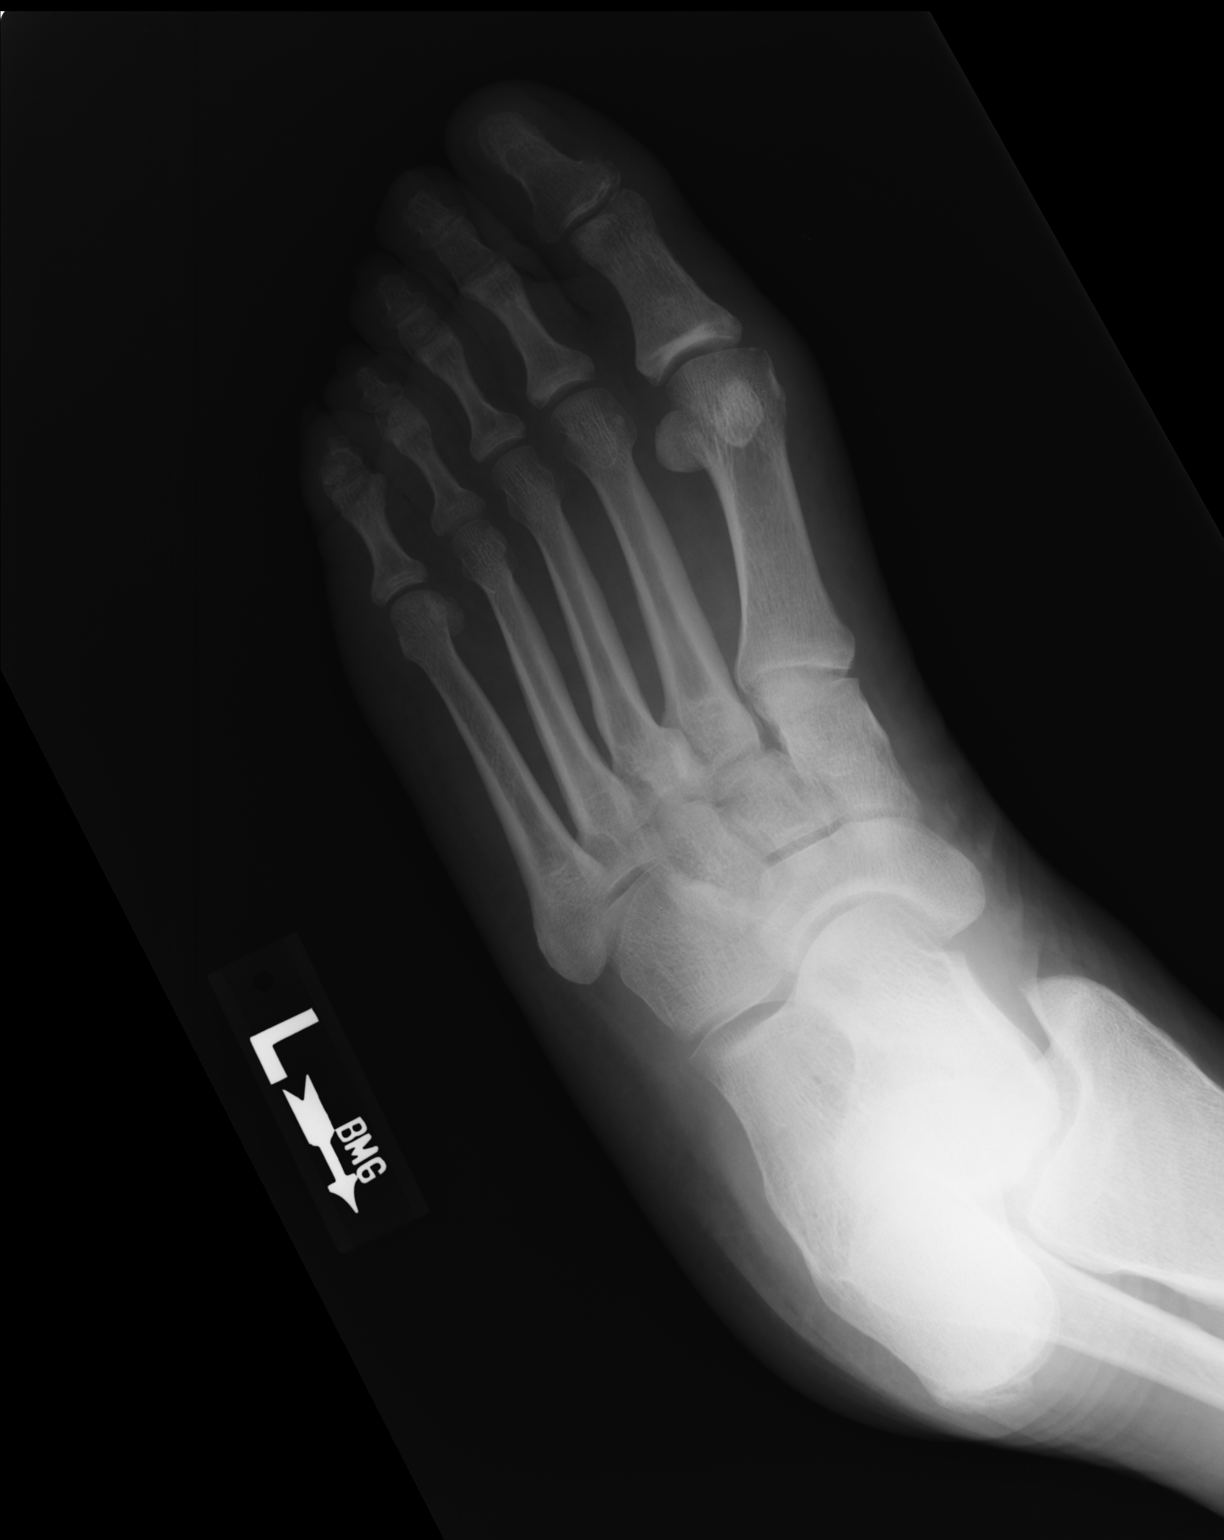

[ap obl int rot]
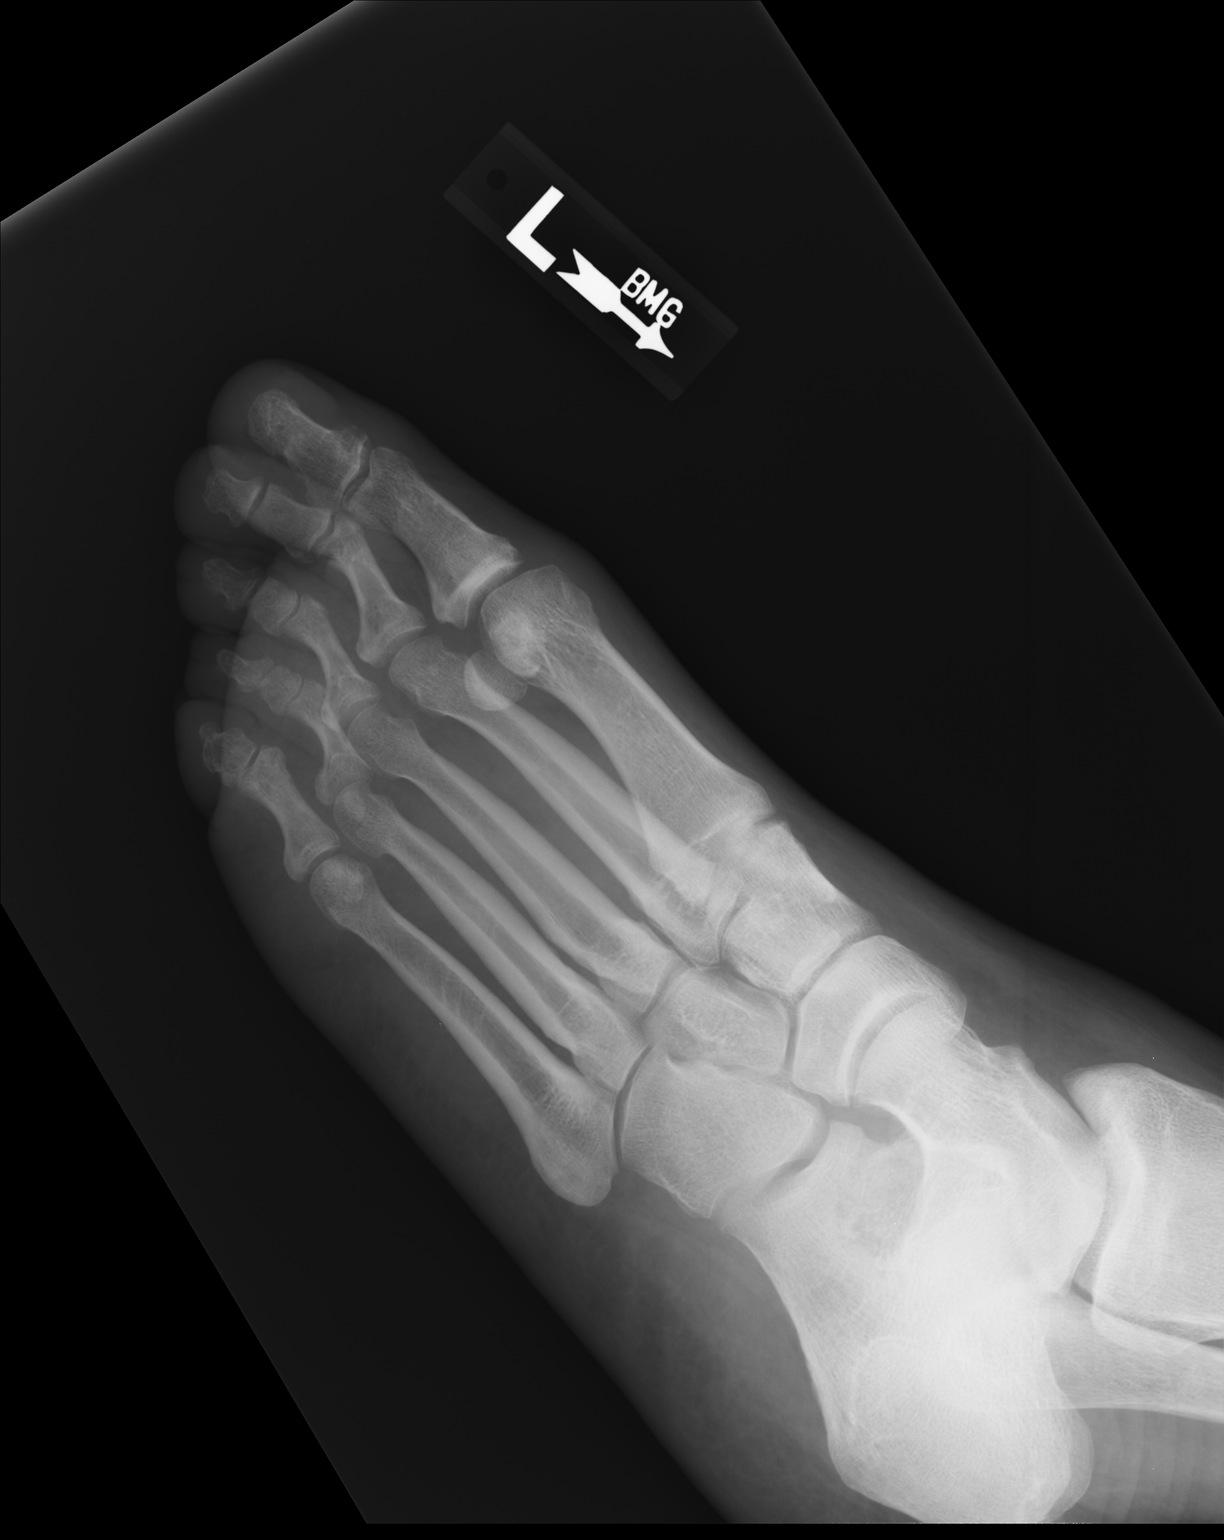

[lateral]
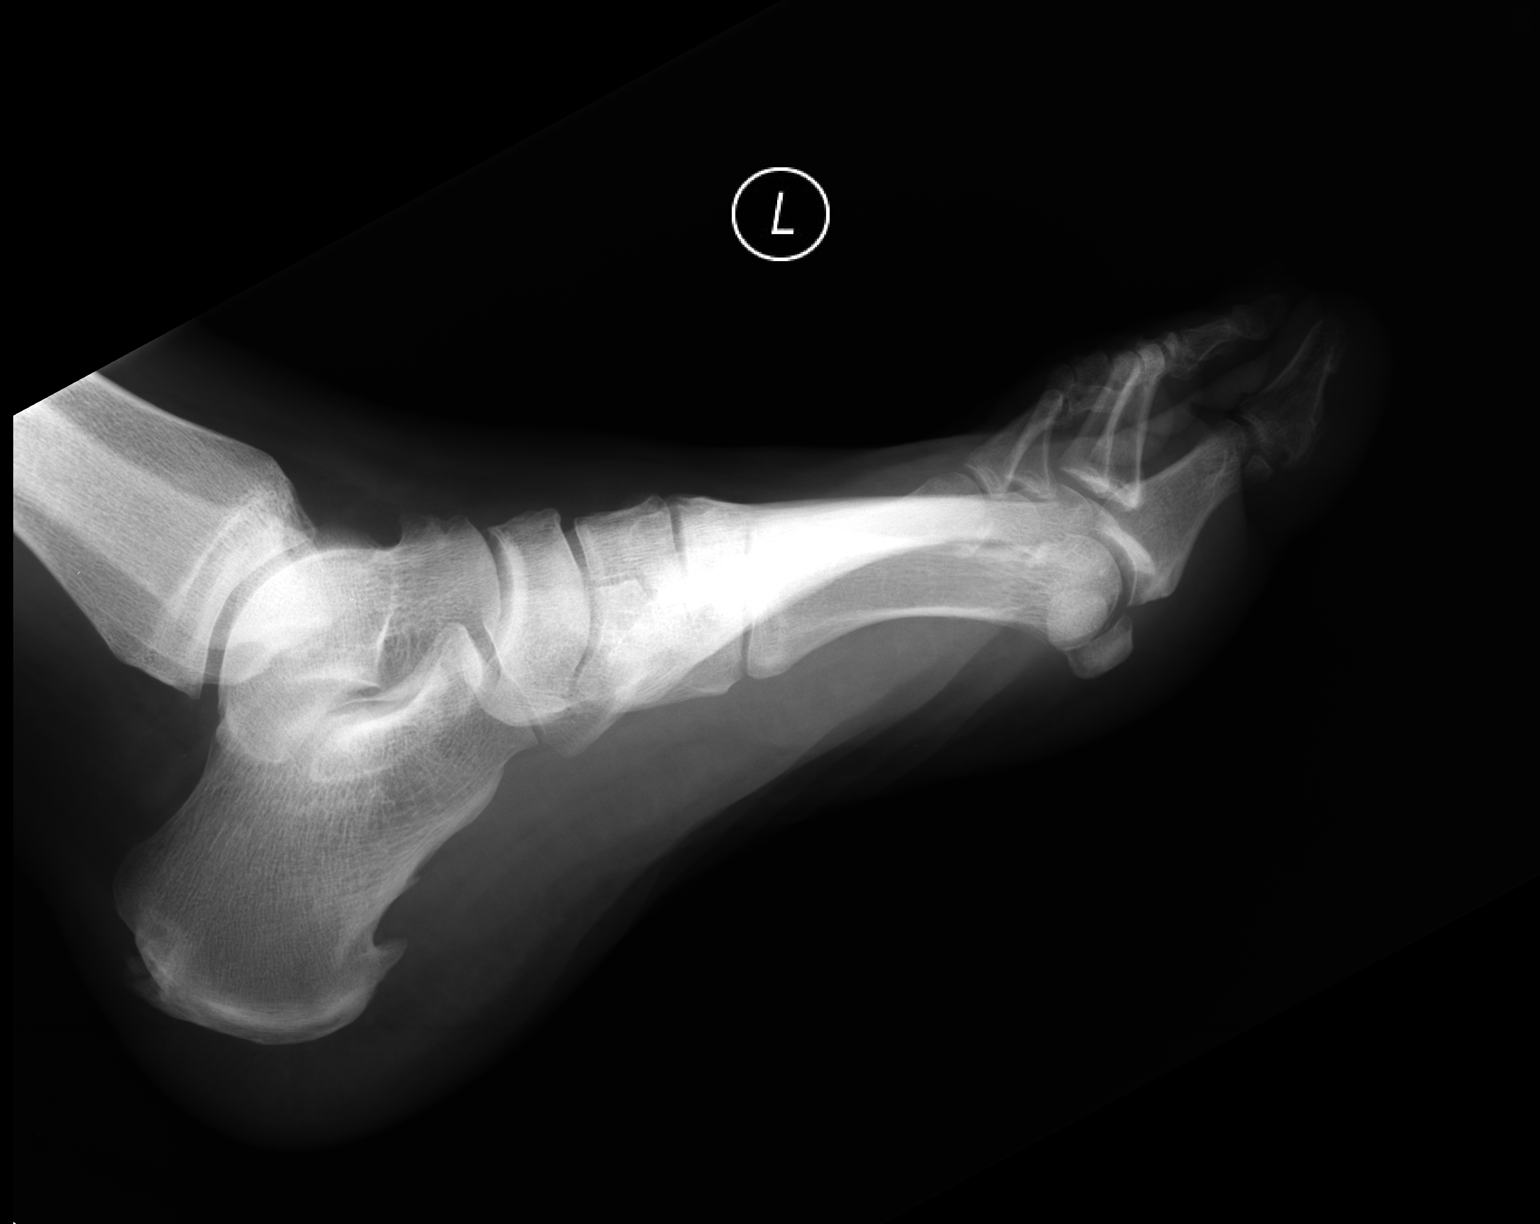

[3 of 3 positions shown; findings below may reference images not displayed]

FINDINGS: There is no evidence of fracture, subluxation or dislocation.

The Lisfranc joints are unremarkable.

A small calcaneal spur is present.

The joint spaces are within normal limits.
IMPRESSION: Small calcaneal spur without other significant abnormality.

## 2018-08-04 ENCOUNTER — Emergency Department (HOSPITAL_COMMUNITY): Payer: 59

## 2018-08-04 ENCOUNTER — Encounter (HOSPITAL_COMMUNITY): Admission: EM | Disposition: E | Payer: Self-pay | Source: Home / Self Care | Attending: Vascular Surgery

## 2018-08-04 ENCOUNTER — Other Ambulatory Visit: Payer: Self-pay

## 2018-08-04 ENCOUNTER — Emergency Department (HOSPITAL_COMMUNITY): Payer: 59 | Admitting: Certified Registered"

## 2018-08-04 ENCOUNTER — Inpatient Hospital Stay (HOSPITAL_COMMUNITY)
Admission: EM | Admit: 2018-08-04 | Discharge: 2018-08-24 | DRG: 270 | Disposition: E | Payer: 59 | Attending: Vascular Surgery | Admitting: Vascular Surgery

## 2018-08-04 ENCOUNTER — Encounter (HOSPITAL_COMMUNITY): Payer: Self-pay | Admitting: Emergency Medicine

## 2018-08-04 DIAGNOSIS — I5022 Chronic systolic (congestive) heart failure: Secondary | ICD-10-CM | POA: Diagnosis present

## 2018-08-04 DIAGNOSIS — I952 Hypotension due to drugs: Secondary | ICD-10-CM | POA: Diagnosis not present

## 2018-08-04 DIAGNOSIS — J9601 Acute respiratory failure with hypoxia: Secondary | ICD-10-CM | POA: Diagnosis not present

## 2018-08-04 DIAGNOSIS — E871 Hypo-osmolality and hyponatremia: Secondary | ICD-10-CM | POA: Diagnosis present

## 2018-08-04 DIAGNOSIS — D696 Thrombocytopenia, unspecified: Secondary | ICD-10-CM | POA: Diagnosis present

## 2018-08-04 DIAGNOSIS — K661 Hemoperitoneum: Secondary | ICD-10-CM | POA: Diagnosis present

## 2018-08-04 DIAGNOSIS — I718 Aortic aneurysm of unspecified site, ruptured: Secondary | ICD-10-CM

## 2018-08-04 DIAGNOSIS — R000 Tachycardia, unspecified: Secondary | ICD-10-CM | POA: Diagnosis present

## 2018-08-04 DIAGNOSIS — Z8701 Personal history of pneumonia (recurrent): Secondary | ICD-10-CM

## 2018-08-04 DIAGNOSIS — D62 Acute posthemorrhagic anemia: Secondary | ICD-10-CM | POA: Diagnosis not present

## 2018-08-04 DIAGNOSIS — G9341 Metabolic encephalopathy: Secondary | ICD-10-CM | POA: Diagnosis present

## 2018-08-04 DIAGNOSIS — J9811 Atelectasis: Secondary | ICD-10-CM | POA: Diagnosis not present

## 2018-08-04 DIAGNOSIS — N170 Acute kidney failure with tubular necrosis: Secondary | ICD-10-CM | POA: Diagnosis present

## 2018-08-04 DIAGNOSIS — I713 Abdominal aortic aneurysm, ruptured, unspecified: Secondary | ICD-10-CM | POA: Diagnosis present

## 2018-08-04 DIAGNOSIS — Z8679 Personal history of other diseases of the circulatory system: Secondary | ICD-10-CM

## 2018-08-04 DIAGNOSIS — E875 Hyperkalemia: Secondary | ICD-10-CM | POA: Diagnosis present

## 2018-08-04 DIAGNOSIS — T4275XA Adverse effect of unspecified antiepileptic and sedative-hypnotic drugs, initial encounter: Secondary | ICD-10-CM | POA: Diagnosis not present

## 2018-08-04 DIAGNOSIS — I10 Essential (primary) hypertension: Secondary | ICD-10-CM | POA: Diagnosis not present

## 2018-08-04 DIAGNOSIS — K567 Ileus, unspecified: Secondary | ICD-10-CM | POA: Diagnosis not present

## 2018-08-04 DIAGNOSIS — J95821 Acute postprocedural respiratory failure: Secondary | ICD-10-CM | POA: Diagnosis not present

## 2018-08-04 DIAGNOSIS — R109 Unspecified abdominal pain: Secondary | ICD-10-CM | POA: Diagnosis present

## 2018-08-04 DIAGNOSIS — I110 Hypertensive heart disease with heart failure: Secondary | ICD-10-CM | POA: Diagnosis present

## 2018-08-04 DIAGNOSIS — R34 Anuria and oliguria: Secondary | ICD-10-CM | POA: Diagnosis present

## 2018-08-04 DIAGNOSIS — J45909 Unspecified asthma, uncomplicated: Secondary | ICD-10-CM | POA: Diagnosis present

## 2018-08-04 DIAGNOSIS — Z978 Presence of other specified devices: Secondary | ICD-10-CM

## 2018-08-04 DIAGNOSIS — Z1159 Encounter for screening for other viral diseases: Secondary | ICD-10-CM

## 2018-08-04 DIAGNOSIS — R066 Hiccough: Secondary | ICD-10-CM | POA: Diagnosis not present

## 2018-08-04 DIAGNOSIS — Z9889 Other specified postprocedural states: Secondary | ICD-10-CM

## 2018-08-04 DIAGNOSIS — G9340 Encephalopathy, unspecified: Secondary | ICD-10-CM | POA: Diagnosis not present

## 2018-08-04 HISTORY — PX: THORACOABDOMINAL AORTIC ANEURYSM REPAIR: SHX2504

## 2018-08-04 HISTORY — PX: ULTRASOUND GUIDANCE FOR VASCULAR ACCESS: SHX6516

## 2018-08-04 LAB — COMPREHENSIVE METABOLIC PANEL
ALT: 20 U/L (ref 0–44)
AST: 17 U/L (ref 15–41)
Albumin: 3.5 g/dL (ref 3.5–5.0)
Alkaline Phosphatase: 57 U/L (ref 38–126)
Anion gap: 12 (ref 5–15)
BUN: 36 mg/dL — ABNORMAL HIGH (ref 6–20)
CO2: 27 mmol/L (ref 22–32)
Calcium: 8.5 mg/dL — ABNORMAL LOW (ref 8.9–10.3)
Chloride: 97 mmol/L — ABNORMAL LOW (ref 98–111)
Creatinine, Ser: 4.81 mg/dL — ABNORMAL HIGH (ref 0.61–1.24)
GFR calc Af Amer: 15 mL/min — ABNORMAL LOW (ref >60)
GFR calc non Af Amer: 13 mL/min — ABNORMAL LOW (ref >60)
Glucose, Bld: 120 mg/dL — ABNORMAL HIGH (ref 70–99)
Potassium: 4.3 mmol/L (ref 3.5–5.1)
Sodium: 136 mmol/L (ref 135–145)
Total Bilirubin: 1 mg/dL (ref 0.3–1.2)
Total Protein: 6.1 g/dL — ABNORMAL LOW (ref 6.5–8.1)

## 2018-08-04 LAB — CBC
HCT: 32.1 % — ABNORMAL LOW (ref 39.0–52.0)
Hemoglobin: 9.5 g/dL — ABNORMAL LOW (ref 13.0–17.0)
MCH: 26 pg (ref 26.0–34.0)
MCHC: 29.6 g/dL — ABNORMAL LOW (ref 30.0–36.0)
MCV: 87.9 fL (ref 80.0–100.0)
Platelets: 157 10*3/uL (ref 150–400)
RBC: 3.65 MIL/uL — ABNORMAL LOW (ref 4.22–5.81)
RDW: 16.5 % — ABNORMAL HIGH (ref 11.5–15.5)
WBC: 10.7 10*3/uL — ABNORMAL HIGH (ref 4.0–10.5)
nRBC: 0 % (ref 0.0–0.2)

## 2018-08-04 LAB — LIPASE, BLOOD: Lipase: 21 U/L (ref 11–51)

## 2018-08-04 LAB — SARS CORONAVIRUS 2 BY RT PCR (HOSPITAL ORDER, PERFORMED IN ~~LOC~~ HOSPITAL LAB): SARS Coronavirus 2: NEGATIVE

## 2018-08-04 LAB — PREPARE RBC (CROSSMATCH)

## 2018-08-04 SURGERY — REPAIR, ANEURYSM, AORTA, THORACOABDOMINAL
Anesthesia: General | Site: Groin | Laterality: Right

## 2018-08-04 MED ORDER — LIDOCAINE 2% (20 MG/ML) 5 ML SYRINGE
INTRAMUSCULAR | Status: AC
  Filled 2018-08-04: qty 5

## 2018-08-04 MED ORDER — ONDANSETRON HCL 4 MG/2ML IJ SOLN
4.0000 mg | Freq: Once | INTRAMUSCULAR | Status: AC
  Administered 2018-08-04: 4 mg via INTRAVENOUS
  Filled 2018-08-04: qty 2

## 2018-08-04 MED ORDER — ROCURONIUM 10MG/ML (10ML) SYRINGE FOR MEDFUSION PUMP - OPTIME
INTRAVENOUS | Status: DC | PRN
  Administered 2018-08-04 – 2018-08-05 (×4): 100 mg via INTRAVENOUS

## 2018-08-04 MED ORDER — ALBUMIN HUMAN 5 % IV SOLN
INTRAVENOUS | Status: DC | PRN
  Administered 2018-08-04 – 2018-08-05 (×4): via INTRAVENOUS

## 2018-08-04 MED ORDER — LIDOCAINE HCL 1 % IJ SOLN
INTRAMUSCULAR | Status: DC | PRN
  Administered 2018-08-04: 30 mL

## 2018-08-04 MED ORDER — 0.9 % SODIUM CHLORIDE (POUR BTL) OPTIME
TOPICAL | Status: DC | PRN
  Administered 2018-08-04: 21:00:00 2000 mL

## 2018-08-04 MED ORDER — IODIXANOL 320 MG/ML IV SOLN
INTRAVENOUS | Status: DC | PRN
  Administered 2018-08-04: 50 mL via INTRAVENOUS

## 2018-08-04 MED ORDER — SUFENTANIL CITRATE 50 MCG/ML IV SOLN
INTRAVENOUS | Status: AC
  Filled 2018-08-04: qty 1

## 2018-08-04 MED ORDER — MIDAZOLAM HCL 5 MG/5ML IJ SOLN
INTRAMUSCULAR | Status: DC | PRN
  Administered 2018-08-04: 2 mg via INTRAVENOUS

## 2018-08-04 MED ORDER — HEPARIN SODIUM (PORCINE) 1000 UNIT/ML IJ SOLN
INTRAMUSCULAR | Status: DC | PRN
  Administered 2018-08-04 (×2): 10000 [IU] via INTRAVENOUS

## 2018-08-04 MED ORDER — SUFENTANIL CITRATE 50 MCG/ML IV SOLN
INTRAVENOUS | Status: DC | PRN
  Administered 2018-08-04: 20 ug via INTRAVENOUS
  Administered 2018-08-04 – 2018-08-05 (×8): 10 ug via INTRAVENOUS

## 2018-08-04 MED ORDER — ROCURONIUM BROMIDE 10 MG/ML (PF) SYRINGE
PREFILLED_SYRINGE | INTRAVENOUS | Status: AC
  Filled 2018-08-04: qty 10

## 2018-08-04 MED ORDER — PROPOFOL 10 MG/ML IV BOLUS
INTRAVENOUS | Status: DC | PRN
  Administered 2018-08-04: 80 mg via INTRAVENOUS

## 2018-08-04 MED ORDER — CEFAZOLIN SODIUM 1 G IJ SOLR
INTRAMUSCULAR | Status: AC
  Filled 2018-08-04: qty 20

## 2018-08-04 MED ORDER — LIDOCAINE HCL (PF) 1 % IJ SOLN
INTRAMUSCULAR | Status: AC
  Filled 2018-08-04: qty 30

## 2018-08-04 MED ORDER — CEFAZOLIN SODIUM-DEXTROSE 2-3 GM-%(50ML) IV SOLR
INTRAVENOUS | Status: DC | PRN
  Administered 2018-08-04 – 2018-08-05 (×2): 2 g via INTRAVENOUS

## 2018-08-04 MED ORDER — IOHEXOL 300 MG/ML  SOLN: 30.0000 mL | Freq: Once | INTRAMUSCULAR | Status: DC | PRN

## 2018-08-04 MED ORDER — ROCURONIUM BROMIDE 10 MG/ML (PF) SYRINGE
PREFILLED_SYRINGE | INTRAVENOUS | Status: AC
  Filled 2018-08-04: qty 20

## 2018-08-04 MED ORDER — SODIUM CHLORIDE 0.9% IV SOLUTION: Freq: Once | INTRAVENOUS | Status: DC

## 2018-08-04 MED ORDER — MIDAZOLAM HCL 2 MG/2ML IJ SOLN
INTRAMUSCULAR | Status: AC
  Filled 2018-08-04: qty 2

## 2018-08-04 MED ORDER — CALCIUM CHLORIDE 10 % IV SOLN
INTRAVENOUS | Status: DC | PRN
  Administered 2018-08-04: 1 g via INTRAVENOUS

## 2018-08-04 MED ORDER — SUCCINYLCHOLINE CHLORIDE 200 MG/10ML IV SOSY
PREFILLED_SYRINGE | INTRAVENOUS | Status: AC
  Filled 2018-08-04: qty 10

## 2018-08-04 MED ORDER — MORPHINE SULFATE (PF) 4 MG/ML IV SOLN
6.0000 mg | Freq: Once | INTRAVENOUS | Status: AC
  Administered 2018-08-04: 6 mg via INTRAVENOUS
  Filled 2018-08-04: qty 2

## 2018-08-04 MED ORDER — LACTATED RINGERS IV SOLN
INTRAVENOUS | Status: DC | PRN
  Administered 2018-08-04 (×5): via INTRAVENOUS

## 2018-08-04 MED ORDER — SODIUM CHLORIDE (PF) 0.9 % IJ SOLN
INTRAMUSCULAR | Status: AC
  Filled 2018-08-04: qty 10

## 2018-08-04 MED ORDER — LIDOCAINE 2% (20 MG/ML) 5 ML SYRINGE
INTRAMUSCULAR | Status: DC | PRN
  Administered 2018-08-04: 100 mg via INTRAVENOUS

## 2018-08-04 MED ORDER — SODIUM CHLORIDE 0.9 % IV SOLN
INTRAVENOUS | Status: AC
  Filled 2018-08-04: qty 1.2

## 2018-08-04 MED ORDER — SUCCINYLCHOLINE CHLORIDE 200 MG/10ML IV SOSY
PREFILLED_SYRINGE | INTRAVENOUS | Status: DC | PRN
  Administered 2018-08-04: 150 mg via INTRAVENOUS

## 2018-08-04 MED ORDER — SODIUM CHLORIDE 0.9 % IV SOLN
INTRAVENOUS | Status: DC | PRN
  Administered 2018-08-04 – 2018-08-05 (×3): via INTRAVENOUS

## 2018-08-04 MED ORDER — PROPOFOL 10 MG/ML IV BOLUS
INTRAVENOUS | Status: AC
  Filled 2018-08-04: qty 20

## 2018-08-04 MED ORDER — SODIUM CHLORIDE 0.9 % IV SOLN
INTRAVENOUS | Status: DC | PRN
  Administered 2018-08-04: 500 mL

## 2018-08-04 MED ORDER — SODIUM CHLORIDE 0.9% FLUSH
3.0000 mL | Freq: Once | INTRAVENOUS | Status: AC
  Administered 2018-08-04: 3 mL via INTRAVENOUS

## 2018-08-04 SURGICAL SUPPLY — 78 items
ADH SKN CLS APL DERMABOND .7 (GAUZE/BANDAGES/DRESSINGS) ×2
BAG SNAP BAND KOVER 36X36 (MISCELLANEOUS) ×2 IMPLANT
CANISTER SUCT 3000ML PPV (MISCELLANEOUS) ×3 IMPLANT
CANNULA VESSEL 3MM 2 BLNT TIP (CANNULA) ×6 IMPLANT
CATH BEACON 5 .035 65 KMP TIP (CATHETERS) ×3 IMPLANT
CATH BEACON 5.038 65CM KMP-01 (CATHETERS) ×3 IMPLANT
CATH OMNI FLUSH .035X70CM (CATHETERS) ×3 IMPLANT
CLIP VESOCCLUDE MED 24/CT (CLIP) ×2 IMPLANT
CLIP VESOCCLUDE SM WIDE 24/CT (CLIP) ×3 IMPLANT
COVER PROBE W GEL 5X96 (DRAPES) ×3 IMPLANT
COVER WAND RF STERILE (DRAPES) ×3 IMPLANT
DECANTER SPIKE VIAL GLASS SM (MISCELLANEOUS) ×3 IMPLANT
DERMABOND ADVANCED (GAUZE/BANDAGES/DRESSINGS) ×1
DERMABOND ADVANCED .7 DNX12 (GAUZE/BANDAGES/DRESSINGS) ×2 IMPLANT
DEVICE CLOSURE PERCLS PRGLD 6F (VASCULAR PRODUCTS) ×4 IMPLANT
DEVICE TORQUE KENDALL .025-038 (MISCELLANEOUS) IMPLANT
DRAPE INCISE IOBAN 66X45 STRL (DRAPES) ×3 IMPLANT
DRSG COVADERM 4X14 (GAUZE/BANDAGES/DRESSINGS) ×3 IMPLANT
DRSG TEGADERM 2-3/8X2-3/4 SM (GAUZE/BANDAGES/DRESSINGS) ×3 IMPLANT
DRYSEAL FLEXSHEATH 12FR 33CM (SHEATH) ×1
ELECT BLADE 4.0 EZ CLEAN MEGAD (MISCELLANEOUS) ×3
ELECT REM PT RETURN 9FT ADLT (ELECTROSURGICAL) ×6
ELECTRODE BLDE 4.0 EZ CLN MEGD (MISCELLANEOUS) ×2 IMPLANT
ELECTRODE REM PT RTRN 9FT ADLT (ELECTROSURGICAL) ×4 IMPLANT
FELT TEFLON 1X6 (MISCELLANEOUS) ×3 IMPLANT
GLOVE BIO SURGEON STRL SZ 6.5 (GLOVE) ×9 IMPLANT
GLOVE BIO SURGEON STRL SZ7.5 (GLOVE) ×6 IMPLANT
GLOVE BIOGEL PI IND STRL 7.5 (GLOVE) ×6 IMPLANT
GLOVE BIOGEL PI IND STRL 8 (GLOVE) ×2 IMPLANT
GLOVE BIOGEL PI INDICATOR 7.5 (GLOVE) ×3
GLOVE BIOGEL PI INDICATOR 8 (GLOVE) ×1
GOWN STRL REUS W/ TWL LRG LVL3 (GOWN DISPOSABLE) ×12 IMPLANT
GOWN STRL REUS W/TWL LRG LVL3 (GOWN DISPOSABLE) ×18
GRAFT BALLN CATH 65CM (STENTS) ×2 IMPLANT
GRAFT HEMASHIELD 18X9MM (Vascular Products) ×3 IMPLANT
GUIDEWIRE ANGLED .035X150CM (WIRE) IMPLANT
INSERT FOGARTY 61MM (MISCELLANEOUS) ×6 IMPLANT
INSERT FOGARTY SM (MISCELLANEOUS) ×8 IMPLANT
KIT BASIN OR (CUSTOM PROCEDURE TRAY) ×3 IMPLANT
KIT TURNOVER KIT B (KITS) ×3 IMPLANT
NEEDLE PERC 18GX7CM (NEEDLE) ×3 IMPLANT
NS IRRIG 1000ML POUR BTL (IV SOLUTION) ×3 IMPLANT
PACK ENDOVASCULAR (PACKS) ×3 IMPLANT
PAD ARMBOARD 7.5X6 YLW CONV (MISCELLANEOUS) ×6 IMPLANT
PERCLOSE PROGLIDE 6F (VASCULAR PRODUCTS) ×6
RETAINER VISCERA MED (MISCELLANEOUS) ×2 IMPLANT
SET MICROPUNCTURE 5F STIFF (MISCELLANEOUS) ×3 IMPLANT
SHEATH BRITE TIP 8FR 23CM (SHEATH) ×3 IMPLANT
SHEATH DRYSEAL FLEX 12FR 33CM (SHEATH) ×2 IMPLANT
SHEATH FAST CATH 10F 12CM (SHEATH) ×2 IMPLANT
SHEATH PINNACLE 5F 10CM (SHEATH) ×3 IMPLANT
SHEATH PINNACLE 8F 10CM (SHEATH) ×3 IMPLANT
SPONGE SURGIFOAM ABS GEL 100 (HEMOSTASIS) ×2 IMPLANT
STAPLER VISISTAT 35W (STAPLE) ×6 IMPLANT
STENT GRAFT BALLN CATH 65CM (STENTS) ×1
STOPCOCK MORSE 400PSI 3WAY (MISCELLANEOUS) ×3 IMPLANT
SUT ETHILON 2 0 FS 18 (SUTURE) ×3 IMPLANT
SUT ETHILON 3 0 PS 1 (SUTURE) ×3 IMPLANT
SUT PDS AB 1 TP1 54 (SUTURE) ×6 IMPLANT
SUT PROLENE 3 0 SH DA (SUTURE) ×36 IMPLANT
SUT PROLENE 5 0 C 1 24 (SUTURE) IMPLANT
SUT SILK 2 0 SH CR/8 (SUTURE) ×3 IMPLANT
SUT SILK 2 0SH CR/8 30 (SUTURE) ×3 IMPLANT
SUT VIC AB 2-0 SH 27 (SUTURE)
SUT VIC AB 2-0 SH 27XBRD (SUTURE) IMPLANT
SUT VIC AB 3-0 SH 27 (SUTURE) ×12
SUT VIC AB 3-0 SH 27X BRD (SUTURE) ×4 IMPLANT
SUT VIC AB 4-0 PS2 27 (SUTURE) ×2 IMPLANT
SUT VICRYL 4-0 PS2 18IN ABS (SUTURE) ×4 IMPLANT
SYR 10ML LL (SYRINGE) ×6 IMPLANT
SYR 20CC LL (SYRINGE) ×3 IMPLANT
SYR 30ML LL (SYRINGE) ×6 IMPLANT
SYR CONTROL 10ML LL (SYRINGE) ×2 IMPLANT
TOWEL GREEN STERILE (TOWEL DISPOSABLE) ×3 IMPLANT
TRAY FOLEY MTR SLVR 16FR STAT (SET/KITS/TRAYS/PACK) ×3 IMPLANT
TUBING HIGH PRESSURE 120CM (CONNECTOR) IMPLANT
WIRE AMPLATZ SS-J .035X180CM (WIRE) ×6 IMPLANT
WIRE BENTSON .035X145CM (WIRE) ×4 IMPLANT

## 2018-08-04 NOTE — ED Notes (Signed)
[facility]link leaving with pt at this time.

## 2018-08-04 NOTE — ED Provider Notes (Signed)
[facility] DEPT Provider Note   CSN: [id number] Arrival date & time: 08/09/2018  1335     History   Chief Complaint Chief Complaint  Patient presents with  . Abdominal Pain    HPI Brandon Snyder is a 53 y.o. male.     53 year old male presents with 2 days of abdominal discomfort which is now localized to his right lower quadrant.  Patient thought initially that he had food poisoning due to nausea and vomiting.  Denies any fever.  Went to urgent care and diagnosed with gastroenteritis.  Continues to note discomfort today.  Denies any urinary symptoms no prior history of same.  Has been self-medicating without relief.     Past Medical History:  Diagnosis Date  . Asthma   . CHF (congestive heart failure) ([place])   . Hypertension   . Pneumonia     Patient Active Problem List   Diagnosis Date Noted  . Acute exacerbation of CHF (congestive heart failure) ([place]) 05/08/2012  . HTN (hypertension) 05/08/2012    History reviewed. No pertinent surgical history.      Home Medications    Prior to Admission medications   Medication Sig Start Date End Date Taking? Authorizing Provider  carvedilol (COREG) 12.5 MG tablet Take 2 tablets (25 mg total) by mouth 2 (two) times daily. PATIENT NEEDS OFFICE VISIT FOR ADDITIONAL REFILLS 12/23/13   Le, Thao P, DO  furosemide (LASIX) 40 MG tablet Take 1 tablet (40 mg total) by mouth 2 (two) times daily. 1 tab twice daily. 02/21/13   Le, Thao P, DO  isosorbide-hydrALAZINE (BIDIL) 20-37.5 MG per tablet Take 1 tablet by mouth 3 (three) times daily. 02/21/13   Le, Thao P, DO  potassium chloride (KLOR-CON) 20 MEQ packet Take 40 mEq by mouth daily. 02/21/13   Glenford Bayley, DO    Family History Family History  Problem Relation Age of Onset  . Cancer Mother     Social History Social History   Tobacco Use  . Smoking status: Never Smoker  . Smokeless tobacco: Never Used  Substance Use Topics  . Alcohol use: Yes   Comment: occasional  . Drug use: No     Allergies   Patient has no known allergies.   Review of Systems Review of Systems  All other systems reviewed and are negative.    Physical Exam Updated Vital Signs BP 101/71 (BP Location: Left Arm)   Pulse 94   Temp 98.2 F (36.8 C) (Oral)   Resp 16   SpO2 94%   Physical Exam Vitals signs and nursing note reviewed.  Constitutional:      General: He is not in acute distress.    Appearance: Normal appearance. He is well-developed. He is not toxic-appearing.  HENT:     Head: Normocephalic and atraumatic.  Eyes:     General: Lids are normal.     Conjunctiva/sclera: Conjunctivae normal.     Pupils: Pupils are equal, round, and reactive to light.  Neck:     Musculoskeletal: Normal range of motion and neck supple.     Thyroid: No thyroid mass.     Trachea: No tracheal deviation.  Cardiovascular:     Rate and Rhythm: Normal rate and regular rhythm.     Heart sounds: Normal heart sounds. No murmur. No gallop.   Pulmonary:     Effort: Pulmonary effort is normal. No respiratory distress.     Breath sounds: Normal breath sounds. No stridor. No decreased breath  sounds, wheezing, rhonchi or rales.  Abdominal:     General: Bowel sounds are normal. There is no distension.     Palpations: Abdomen is soft.     Tenderness: There is abdominal tenderness in the right lower quadrant. There is guarding. There is no rebound.    Musculoskeletal: Normal range of motion.        General: No tenderness.  Skin:    General: Skin is warm and dry.     Findings: No abrasion or rash.  Neurological:     Mental Status: He is alert and oriented to person, place, and time.     GCS: GCS eye subscore is 4. GCS verbal subscore is 5. GCS motor subscore is 6.     Cranial Nerves: No cranial nerve deficit.     Sensory: No sensory deficit.  Psychiatric:        Speech: Speech normal.        Behavior: Behavior normal.      ED Treatments / Results  Labs  (all labs ordered are listed, but only abnormal results are displayed) Labs Reviewed  SARS CORONAVIRUS 2 (HOSPITAL ORDER, PERFORMED IN [facility])  LIPASE, BLOOD  COMPREHENSIVE METABOLIC PANEL  CBC  URINALYSIS, ROUTINE W REFLEX MICROSCOPIC    EKG None  Radiology No results found.  Procedures Procedures (including critical care time)  Medications Ordered in ED Medications  sodium chloride flush (NS) 0.9 % injection 3 mL (has no administration in time range)  morphine 4 MG/ML injection 6 mg (has no administration in time range)  ondansetron (ZOFRAN) injection 4 mg (has no administration in time range)     Initial Impression / Assessment and Plan / ED Course  I have reviewed the triage vital signs and the nursing notes.  Pertinent labs & imaging results that were available during my care of the patient were reviewed by me and considered in my medical decision making (see chart for details).        Patient's pain treated with morphine here.  Laboratory studies noted for acute kidney injury with creatinine of 4.8.  Abdominal CT scan ordered and insistent with ruptured 9 cm infrarenal AAA with retroperitoneal blood.  Discussed with Dr. Darrick Pennafields from vascular surgery who is accepted the patient in transfer to come in emergently to operating room 16.  Final Clinical Impressions(s) / ED Diagnoses   Final diagnoses:  None    ED Discharge Orders    None       Lorre NickAllen, Keandrea Tapley, MD 08/03/2018 1949

## 2018-08-04 NOTE — ED Notes (Signed)
Pt. Unable to urinate at this time. Will collect urine when pt. Voids. Nurse aware.  

## 2018-08-04 NOTE — ED Triage Notes (Signed)
Patient here from home with complaints of lower abd pain below belly button that started yesterday after eating [employer]. Reports being seen at Urgent Care on [facility] and was diagnosed with gastroenteritis. Given Zofran and Bentyl  with no relief.

## 2018-08-04 NOTE — ED Notes (Signed)
Pt aware of need for a urine sample but is unable to provide one at this time. 

## 2018-08-04 NOTE — Anesthesia Preprocedure Evaluation (Signed)
Anesthesia Evaluation  Patient identified by MRN, date of birth, ID band Patient awake    Reviewed: Unable to perform ROS - Chart review onlyPreop documentation limited or incomplete due to emergent nature of procedure.  Airway Mallampati: I  TM Distance: >3 FB Neck ROM: Full    Dental  (+) Teeth Intact, Dental Advisory Given   Pulmonary    breath sounds clear to auscultation       Cardiovascular hypertension, + Peripheral Vascular Disease and +CHF   Rhythm:Regular Rate:Normal     Neuro/Psych    GI/Hepatic negative GI ROS, Neg liver ROS,   Endo/Other    Renal/GU negative Renal ROS     Musculoskeletal   Abdominal Normal abdominal exam  (+)   Peds  Hematology   Anesthesia Other Findings   Reproductive/Obstetrics                             Anesthesia Physical Anesthesia Plan  ASA: IV and emergent  Anesthesia Plan: General   Post-op Pain Management:    Induction: Intravenous  PONV Risk Score and Plan: 2 and Ondansetron, Treatment may vary due to age or medical condition and Midazolam  Airway Management Planned: Oral ETT  Additional Equipment: Arterial line, CVP and Ultrasound Guidance Line Placement  Intra-op Plan:   Post-operative Plan: Post-operative intubation/ventilation  Informed Consent: I have reviewed the patients History and Physical, chart, labs and discussed the procedure including the risks, benefits and alternatives for the proposed anesthesia with the patient or authorized representative who has indicated his/her understanding and acceptance.     Dental advisory given  Plan Discussed with: CRNA  Anesthesia Plan Comments:         Anesthesia Quick Evaluation

## 2018-08-04 NOTE — Anesthesia Procedure Notes (Signed)
Procedure Name: Intubation Date/Time: 07/29/2018 9:21 PM Performed by: Claris Che, CRNA Pre-anesthesia Checklist: Patient identified, Emergency Drugs available, Suction available, Patient being monitored and Timeout performed Patient Re-evaluated:Patient Re-evaluated prior to induction Oxygen Delivery Method: Circle system utilized Preoxygenation: Pre-oxygenation with 100% oxygen Induction Type: IV induction, Rapid sequence and Cricoid Pressure applied Laryngoscope Size: Mac and 3 Grade View: Grade II Tube size: 7.5 mm Number of attempts: 1 Airway Equipment and Method: Stylet Placement Confirmation: ETT inserted through vocal cords under direct vision,  positive ETCO2 and breath sounds checked- equal and bilateral Secured at: 24 cm Tube secured with: Tape Dental Injury: Teeth and Oropharynx as per pre-operative assessment

## 2018-08-04 NOTE — Anesthesia Procedure Notes (Signed)
Central Venous Catheter Insertion Performed by: Effie Berkshire, MD, anesthesiologist Start/End07/22/2020 8:40 PM, 08/22/2018 8:50 PM Patient location: Pre-op. Preanesthetic checklist: patient identified, IV checked, site marked, risks and benefits discussed, surgical consent, monitors and equipment checked, pre-op evaluation, timeout performed and anesthesia consent Position: Trendelenburg Lidocaine 1% used for infiltration and patient sedated Hand hygiene performed , maximum sterile barriers used  and Seldinger technique used Catheter size: 9 Fr Total catheter length 10. Central line was placed.MAC introducer Swan type:thermodilution PA Cath depth:50 Procedure performed using ultrasound guided technique. Ultrasound Notes:anatomy identified, needle tip was noted to be adjacent to the nerve/plexus identified, no ultrasound evidence of intravascular and/or intraneural injection and image(s) printed for medical record Attempts: 1 Following insertion, line sutured and dressing applied. Post procedure assessment: blood return through all ports, free fluid flow and no air  Patient tolerated the procedure well with no immediate complications.

## 2018-08-04 NOTE — Anesthesia Procedure Notes (Signed)
Arterial Line Insertion Start/End07/11/2018 8:25 PM, 08/03/2018 8:35 PM Performed by: Valetta Fuller, CRNA  Patient location: OR. Patient sedated Right, radial was placed Catheter size: 20 G Hand hygiene performed  and maximum sterile barriers used   Attempts: 1 Procedure performed without using ultrasound guided technique. Following insertion, dressing applied and Biopatch. Post procedure assessment: normal  Patient tolerated the procedure well with no immediate complications.

## 2018-08-05 ENCOUNTER — Inpatient Hospital Stay (HOSPITAL_COMMUNITY): Payer: 59

## 2018-08-05 ENCOUNTER — Encounter (HOSPITAL_COMMUNITY): Payer: Self-pay | Admitting: Vascular Surgery

## 2018-08-05 DIAGNOSIS — I713 Abdominal aortic aneurysm, ruptured: Principal | ICD-10-CM

## 2018-08-05 DIAGNOSIS — J9601 Acute respiratory failure with hypoxia: Secondary | ICD-10-CM

## 2018-08-05 DIAGNOSIS — G9340 Encephalopathy, unspecified: Secondary | ICD-10-CM

## 2018-08-05 DIAGNOSIS — I10 Essential (primary) hypertension: Secondary | ICD-10-CM

## 2018-08-05 LAB — POCT I-STAT 7, (LYTES, BLD GAS, ICA,H+H)
Acid-Base Excess: 1 mmol/L (ref 0.0–2.0)
Acid-Base Excess: 1 mmol/L (ref 0.0–2.0)
Acid-base deficit: 1 mmol/L (ref 0.0–2.0)
Acid-base deficit: 3 mmol/L — ABNORMAL HIGH (ref 0.0–2.0)
Acid-base deficit: 3 mmol/L — ABNORMAL HIGH (ref 0.0–2.0)
Bicarbonate: 21.1 mmol/L (ref 20.0–28.0)
Bicarbonate: 23.1 mmol/L (ref 20.0–28.0)
Bicarbonate: 23.9 mmol/L (ref 20.0–28.0)
Bicarbonate: 27.9 mmol/L (ref 20.0–28.0)
Bicarbonate: 26.7 mmol/L (ref 20.0–28.0)
Bicarbonate: 27.2 mmol/L (ref 20.0–28.0)
Calcium, Ion: 1 mmol/L — ABNORMAL LOW (ref 1.15–1.40)
Calcium, Ion: 1.04 mmol/L — ABNORMAL LOW (ref 1.15–1.40)
Calcium, Ion: 1.17 mmol/L (ref 1.15–1.40)
Calcium, Ion: 1.2 mmol/L (ref 1.15–1.40)
Calcium, Ion: 1.21 mmol/L (ref 1.15–1.40)
Calcium, Ion: 1.18 mmol/L (ref 1.15–1.40)
HCT: 24 % — ABNORMAL LOW (ref 39.0–52.0)
HCT: 30 % — ABNORMAL LOW (ref 39.0–52.0)
HCT: 30 % — ABNORMAL LOW (ref 39.0–52.0)
HCT: 35 % — ABNORMAL LOW (ref 39.0–52.0)
HCT: 37 % — ABNORMAL LOW (ref 39.0–52.0)
HCT: 34 % — ABNORMAL LOW (ref 39.0–52.0)
Hemoglobin: 10.2 g/dL — ABNORMAL LOW (ref 13.0–17.0)
Hemoglobin: 10.2 g/dL — ABNORMAL LOW (ref 13.0–17.0)
Hemoglobin: 11.9 g/dL — ABNORMAL LOW (ref 13.0–17.0)
Hemoglobin: 8.2 g/dL — ABNORMAL LOW (ref 13.0–17.0)
Hemoglobin: 12.6 g/dL — ABNORMAL LOW (ref 13.0–17.0)
Hemoglobin: 11.6 g/dL — ABNORMAL LOW (ref 13.0–17.0)
O2 Saturation: 100 %
O2 Saturation: 100 %
O2 Saturation: 100 %
O2 Saturation: 99 %
O2 Saturation: 97 %
O2 Saturation: 97 %
Patient temperature: 35.8
Patient temperature: 36
Patient temperature: 36
Patient temperature: 97.8
Patient temperature: 98.6
Potassium: 4.5 mmol/L (ref 3.5–5.1)
Potassium: 4.7 mmol/L (ref 3.5–5.1)
Potassium: 5.1 mmol/L (ref 3.5–5.1)
Potassium: 5.4 mmol/L — ABNORMAL HIGH (ref 3.5–5.1)
Potassium: 5.1 mmol/L (ref 3.5–5.1)
Potassium: 4.3 mmol/L (ref 3.5–5.1)
Sodium: 134 mmol/L — ABNORMAL LOW (ref 135–145)
Sodium: 134 mmol/L — ABNORMAL LOW (ref 135–145)
Sodium: 135 mmol/L (ref 135–145)
Sodium: 136 mmol/L (ref 135–145)
Sodium: 136 mmol/L (ref 135–145)
Sodium: 136 mmol/L (ref 135–145)
TCO2: 22 mmol/L (ref 22–32)
TCO2: 24 mmol/L (ref 22–32)
TCO2: 25 mmol/L (ref 22–32)
TCO2: 30 mmol/L (ref 22–32)
TCO2: 28 mmol/L (ref 22–32)
TCO2: 29 mmol/L (ref 22–32)
pCO2 arterial: 30.7 mmHg — ABNORMAL LOW (ref 32.0–48.0)
pCO2 arterial: 33.7 mmHg (ref 32.0–48.0)
pCO2 arterial: 46 mmHg (ref 32.0–48.0)
pCO2 arterial: 53.2 mmHg — ABNORMAL HIGH (ref 32.0–48.0)
pCO2 arterial: 52.5 mmHg — ABNORMAL HIGH (ref 32.0–48.0)
pCO2 arterial: 47.5 mmHg (ref 32.0–48.0)
pH, Arterial: 7.318 — ABNORMAL LOW (ref 7.350–7.450)
pH, Arterial: 7.328 — ABNORMAL LOW (ref 7.350–7.450)
pH, Arterial: 7.44 (ref 7.350–7.450)
pH, Arterial: 7.442 (ref 7.350–7.450)
pH, Arterial: 7.311 — ABNORMAL LOW (ref 7.350–7.450)
pH, Arterial: 7.365 (ref 7.350–7.450)
pO2, Arterial: 162 mmHg — ABNORMAL HIGH (ref 83.0–108.0)
pO2, Arterial: 169 mmHg — ABNORMAL HIGH (ref 83.0–108.0)
pO2, Arterial: 200 mmHg — ABNORMAL HIGH (ref 83.0–108.0)
pO2, Arterial: 224 mmHg — ABNORMAL HIGH (ref 83.0–108.0)
pO2, Arterial: 103 mmHg (ref 83.0–108.0)
pO2, Arterial: 90 mmHg (ref 83.0–108.0)

## 2018-08-05 LAB — SODIUM, URINE, RANDOM: Sodium, Ur: 16 mmol/L

## 2018-08-05 LAB — BASIC METABOLIC PANEL
Anion gap: 10 (ref 5–15)
BUN: 27 mg/dL — ABNORMAL HIGH (ref 6–20)
CO2: 25 mmol/L (ref 22–32)
Calcium: 7.8 mg/dL — ABNORMAL LOW (ref 8.9–10.3)
Chloride: 101 mmol/L (ref 98–111)
Creatinine, Ser: 2.85 mg/dL — ABNORMAL HIGH (ref 0.61–1.24)
GFR calc Af Amer: 28 mL/min — ABNORMAL LOW (ref >60)
GFR calc non Af Amer: 24 mL/min — ABNORMAL LOW (ref >60)
Glucose, Bld: 119 mg/dL — ABNORMAL HIGH (ref 70–99)
Potassium: 4.4 mmol/L (ref 3.5–5.1)
Sodium: 136 mmol/L (ref 135–145)

## 2018-08-05 LAB — CBC WITH DIFFERENTIAL/PLATELET
Abs Immature Granulocytes: 0.08 10*3/uL — ABNORMAL HIGH (ref 0.00–0.07)
Basophils Absolute: 0 10*3/uL (ref 0.0–0.1)
Basophils Relative: 0 %
Eosinophils Absolute: 0.2 10*3/uL (ref 0.0–0.5)
Eosinophils Relative: 2 %
HCT: 32.8 % — ABNORMAL LOW (ref 39.0–52.0)
Hemoglobin: 10.9 g/dL — ABNORMAL LOW (ref 13.0–17.0)
Immature Granulocytes: 1 %
Lymphocytes Relative: 7 %
Lymphs Abs: 0.8 10*3/uL (ref 0.7–4.0)
MCH: 27.9 pg (ref 26.0–34.0)
MCHC: 33.2 g/dL (ref 30.0–36.0)
MCV: 84.1 fL (ref 80.0–100.0)
Monocytes Absolute: 0.8 10*3/uL (ref 0.1–1.0)
Monocytes Relative: 7 %
Neutro Abs: 9.8 10*3/uL — ABNORMAL HIGH (ref 1.7–7.7)
Neutrophils Relative %: 83 %
Platelets: 97 10*3/uL — ABNORMAL LOW (ref 150–400)
RBC: 3.9 MIL/uL — ABNORMAL LOW (ref 4.22–5.81)
RDW: 16 % — ABNORMAL HIGH (ref 11.5–15.5)
WBC: 11.8 10*3/uL — ABNORMAL HIGH (ref 4.0–10.5)
nRBC: 0 % (ref 0.0–0.2)

## 2018-08-05 LAB — PREPARE FRESH FROZEN PLASMA
Unit division: 0
Unit division: 0
Unit division: 0

## 2018-08-05 LAB — BPAM FFP
Blood Product Expiration Date: 202007132359
Blood Product Expiration Date: 202007162359
Blood Product Expiration Date: 202007162359
Blood Product Expiration Date: 202007172359
ISSUE DATE / TIME: 202007122300
ISSUE DATE / TIME: 202007122300
ISSUE DATE / TIME: 202007122300
ISSUE DATE / TIME: 202007130441
Unit Type and Rh: 600
Unit Type and Rh: 6200
Unit Type and Rh: 6200
Unit Type and Rh: 6200

## 2018-08-05 LAB — CREATININE, URINE, RANDOM: Creatinine, Urine: 143.19 mg/dL

## 2018-08-05 LAB — POCT ACTIVATED CLOTTING TIME: Activated Clotting Time: 120 seconds

## 2018-08-05 LAB — COMPREHENSIVE METABOLIC PANEL
ALT: 35 U/L (ref 0–44)
AST: 39 U/L (ref 15–41)
Albumin: 3 g/dL — ABNORMAL LOW (ref 3.5–5.0)
Alkaline Phosphatase: 38 U/L (ref 38–126)
Anion gap: 9 (ref 5–15)
BUN: 31 mg/dL — ABNORMAL HIGH (ref 6–20)
CO2: 24 mmol/L (ref 22–32)
Calcium: 8 mg/dL — ABNORMAL LOW (ref 8.9–10.3)
Chloride: 101 mmol/L (ref 98–111)
Creatinine, Ser: 3.21 mg/dL — ABNORMAL HIGH (ref 0.61–1.24)
GFR calc Af Amer: 24 mL/min — ABNORMAL LOW (ref >60)
GFR calc non Af Amer: 21 mL/min — ABNORMAL LOW (ref >60)
Glucose, Bld: 122 mg/dL — ABNORMAL HIGH (ref 70–99)
Potassium: 5 mmol/L (ref 3.5–5.1)
Sodium: 134 mmol/L — ABNORMAL LOW (ref 135–145)
Total Bilirubin: 5.1 mg/dL — ABNORMAL HIGH (ref 0.3–1.2)
Total Protein: 4.8 g/dL — ABNORMAL LOW (ref 6.5–8.1)

## 2018-08-05 LAB — CBC
HCT: 37 % — ABNORMAL LOW (ref 39.0–52.0)
Hemoglobin: 12.1 g/dL — ABNORMAL LOW (ref 13.0–17.0)
MCH: 28.5 pg (ref 26.0–34.0)
MCHC: 32.7 g/dL (ref 30.0–36.0)
MCV: 87.3 fL (ref 80.0–100.0)
Platelets: 81 10*3/uL — ABNORMAL LOW (ref 150–400)
RBC: 4.24 MIL/uL (ref 4.22–5.81)
RDW: 15.7 % — ABNORMAL HIGH (ref 11.5–15.5)
WBC: 13.4 10*3/uL — ABNORMAL HIGH (ref 4.0–10.5)
nRBC: 0 % (ref 0.0–0.2)

## 2018-08-05 LAB — APTT: aPTT: 34 seconds (ref 24–36)

## 2018-08-05 LAB — PROTIME-INR
INR: 1.4 — ABNORMAL HIGH (ref 0.8–1.2)
Prothrombin Time: 16.7 seconds — ABNORMAL HIGH (ref 11.4–15.2)

## 2018-08-05 LAB — MAGNESIUM: Magnesium: 1.8 mg/dL (ref 1.7–2.4)

## 2018-08-05 LAB — TRIGLYCERIDES: Triglycerides: 168 mg/dL — ABNORMAL HIGH (ref <150)

## 2018-08-05 LAB — AMYLASE: Amylase: 101 U/L — ABNORMAL HIGH (ref 28–100)

## 2018-08-05 LAB — ABO/RH: ABO/RH(D): A POS

## 2018-08-05 LAB — MRSA PCR SCREENING: MRSA by PCR: NEGATIVE

## 2018-08-05 LAB — PHOSPHORUS: Phosphorus: 3.5 mg/dL (ref 2.5–4.6)

## 2018-08-05 MED ORDER — PHENOL 1.4 % MT LIQD: 1.0000 | OROMUCOSAL | Status: DC | PRN

## 2018-08-05 MED ORDER — MAGNESIUM SULFATE 2 GM/50ML IV SOLN: 2.0000 g | Freq: Once | INTRAVENOUS | Status: DC | PRN

## 2018-08-05 MED ORDER — DEXMEDETOMIDINE HCL IN NACL 400 MCG/100ML IV SOLN
0.4000 ug/kg/h | INTRAVENOUS | Status: DC
  Administered 2018-08-05: 0.4 ug/kg/h via INTRAVENOUS
  Administered 2018-08-05: 0.8 ug/kg/h via INTRAVENOUS
  Filled 2018-08-05 (×2): qty 100

## 2018-08-05 MED ORDER — IPRATROPIUM-ALBUTEROL 0.5-2.5 (3) MG/3ML IN SOLN
RESPIRATORY_TRACT | Status: AC
  Administered 2018-08-05: 08:00:00 3 mL
  Filled 2018-08-05: qty 3

## 2018-08-05 MED ORDER — CEFAZOLIN SODIUM-DEXTROSE 2-4 GM/100ML-% IV SOLN
2.0000 g | Freq: Three times a day (TID) | INTRAVENOUS | Status: AC
  Administered 2018-08-05 (×2): 2 g via INTRAVENOUS
  Filled 2018-08-05 (×2): qty 100

## 2018-08-05 MED ORDER — PROPOFOL 1000 MG/100ML IV EMUL
0.0000 ug/kg/min | INTRAVENOUS | Status: DC
  Administered 2018-08-05: 20 ug/kg/min via INTRAVENOUS
  Administered 2018-08-05: 25 ug/kg/min via INTRAVENOUS
  Administered 2018-08-05: 30 ug/kg/min via INTRAVENOUS
  Administered 2018-08-05: 20 ug/kg/min via INTRAVENOUS
  Administered 2018-08-05: 25 ug/kg/min via INTRAVENOUS
  Administered 2018-08-05: 65 ug/kg/min via INTRAVENOUS
  Administered 2018-08-06 (×2): 30 ug/kg/min via INTRAVENOUS
  Filled 2018-08-05: qty 200
  Filled 2018-08-05 (×6): qty 100

## 2018-08-05 MED ORDER — PANTOPRAZOLE SODIUM 40 MG PO TBEC
40.0000 mg | DELAYED_RELEASE_TABLET | Freq: Every day | ORAL | Status: DC
  Filled 2018-08-05: qty 1

## 2018-08-05 MED ORDER — ALUM & MAG HYDROXIDE-SIMETH 200-200-20 MG/5ML PO SUSP: 15.0000 mL | ORAL | Status: DC | PRN

## 2018-08-05 MED ORDER — LABETALOL HCL 5 MG/ML IV SOLN
10.0000 mg | INTRAVENOUS | Status: AC | PRN
  Administered 2018-08-05 – 2018-08-06 (×4): 10 mg via INTRAVENOUS
  Filled 2018-08-05 (×2): qty 4

## 2018-08-05 MED ORDER — OXYCODONE HCL 5 MG/5ML PO SOLN: 10.0000 mg | ORAL | Status: DC

## 2018-08-05 MED ORDER — SODIUM CHLORIDE 0.9 % IV SOLN: 500.0000 mL | Freq: Once | INTRAVENOUS | Status: DC | PRN

## 2018-08-05 MED ORDER — CHLORHEXIDINE GLUCONATE CLOTH 2 % EX PADS: 6.0000 | MEDICATED_PAD | Freq: Every day | CUTANEOUS | Status: DC

## 2018-08-05 MED ORDER — SODIUM CHLORIDE 0.9% FLUSH: 10.0000 mL | INTRAVENOUS | Status: DC | PRN

## 2018-08-05 MED ORDER — PANTOPRAZOLE SODIUM 40 MG IV SOLR: 40.0000 mg | Freq: Every day | INTRAVENOUS | Status: DC

## 2018-08-05 MED ORDER — CHLORHEXIDINE GLUCONATE CLOTH 2 % EX PADS
6.0000 | MEDICATED_PAD | Freq: Every day | CUTANEOUS | Status: DC
  Administered 2018-08-05 – 2018-08-10 (×6): 6 via TOPICAL

## 2018-08-05 MED ORDER — PROTAMINE SULFATE 10 MG/ML IV SOLN
INTRAVENOUS | Status: DC | PRN
  Administered 2018-08-05: 100 mg via INTRAVENOUS

## 2018-08-05 MED ORDER — IPRATROPIUM-ALBUTEROL 0.5-2.5 (3) MG/3ML IN SOLN
3.0000 mL | RESPIRATORY_TRACT | Status: DC
  Administered 2018-08-05 – 2018-08-06 (×6): 3 mL via RESPIRATORY_TRACT
  Filled 2018-08-05 (×6): qty 3

## 2018-08-05 MED ORDER — DOCUSATE SODIUM 100 MG PO CAPS: 100.0000 mg | ORAL_CAPSULE | Freq: Every day | ORAL | Status: DC

## 2018-08-05 MED ORDER — LABETALOL HCL 5 MG/ML IV SOLN
INTRAVENOUS | Status: AC
  Administered 2018-08-05: 20 mg
  Filled 2018-08-05: qty 4

## 2018-08-05 MED ORDER — BISACODYL 10 MG RE SUPP: 10.0000 mg | Freq: Every day | RECTAL | Status: DC | PRN

## 2018-08-05 MED ORDER — DEXTROSE-NACL 5-0.45 % IV SOLN
INTRAVENOUS | Status: DC
  Administered 2018-08-05 – 2018-08-07 (×8): via INTRAVENOUS

## 2018-08-05 MED ORDER — CHLORHEXIDINE GLUCONATE 0.12% ORAL RINSE (MEDLINE KIT)
15.0000 mL | Freq: Two times a day (BID) | OROMUCOSAL | Status: DC
  Administered 2018-08-05 – 2018-08-06 (×3): 15 mL via OROMUCOSAL

## 2018-08-05 MED ORDER — PANTOPRAZOLE SODIUM 40 MG IV SOLR
40.0000 mg | INTRAVENOUS | Status: DC
  Administered 2018-08-05 – 2018-08-10 (×6): 40 mg via INTRAVENOUS
  Filled 2018-08-05 (×6): qty 40

## 2018-08-05 MED ORDER — CEFAZOLIN SODIUM 1 G IJ SOLR
INTRAMUSCULAR | Status: AC
  Filled 2018-08-05: qty 20

## 2018-08-05 MED ORDER — SODIUM CHLORIDE 0.9% IV SOLUTION
Freq: Once | INTRAVENOUS | Status: AC
  Administered 2018-08-05: 04:00:00 via INTRAVENOUS

## 2018-08-05 MED ORDER — HEMOSTATIC AGENTS (NO CHARGE) OPTIME
TOPICAL | Status: DC | PRN
  Administered 2018-08-05: 1 via TOPICAL

## 2018-08-05 MED ORDER — ACETAMINOPHEN 325 MG PO TABS
325.0000 mg | ORAL_TABLET | ORAL | Status: DC | PRN
  Administered 2018-08-05: 650 mg via ORAL
  Filled 2018-08-05: qty 2

## 2018-08-05 MED ORDER — CALCIUM CHLORIDE 10 % IV SOLN
INTRAVENOUS | Status: AC
  Filled 2018-08-05: qty 10

## 2018-08-05 MED ORDER — MORPHINE SULFATE (PF) 2 MG/ML IV SOLN
2.0000 mg | INTRAVENOUS | Status: DC | PRN
  Administered 2018-08-05 (×6): 4 mg via INTRAVENOUS
  Administered 2018-08-05: 5 mg via INTRAVENOUS
  Administered 2018-08-05 (×2): 4 mg via INTRAVENOUS
  Administered 2018-08-06: 2 mg via INTRAVENOUS
  Administered 2018-08-06: 4 mg via INTRAVENOUS
  Administered 2018-08-06 (×2): 5 mg via INTRAVENOUS
  Administered 2018-08-06 (×2): 2 mg via INTRAVENOUS
  Administered 2018-08-06: 3 mg via INTRAVENOUS
  Administered 2018-08-06: 2 mg via INTRAVENOUS
  Administered 2018-08-06: 5 mg via INTRAVENOUS
  Administered 2018-08-07: 2 mg via INTRAVENOUS
  Administered 2018-08-07: 4 mg via INTRAVENOUS
  Administered 2018-08-07: 2 mg via INTRAVENOUS
  Filled 2018-08-05 (×4): qty 1
  Filled 2018-08-05 (×8): qty 2
  Filled 2018-08-05: qty 1
  Filled 2018-08-05 (×2): qty 2
  Filled 2018-08-05: qty 1
  Filled 2018-08-05: qty 3
  Filled 2018-08-05: qty 1
  Filled 2018-08-05: qty 3
  Filled 2018-08-05: qty 1
  Filled 2018-08-05 (×2): qty 3

## 2018-08-05 MED ORDER — GUAIFENESIN-DM 100-10 MG/5ML PO SYRP: 15.0000 mL | ORAL_SOLUTION | ORAL | Status: DC | PRN

## 2018-08-05 MED ORDER — DOCUSATE SODIUM 50 MG/5ML PO LIQD
100.0000 mg | Freq: Every day | ORAL | Status: DC
  Administered 2018-08-05 – 2018-08-10 (×6): 100 mg
  Filled 2018-08-05 (×6): qty 10

## 2018-08-05 MED ORDER — SODIUM CHLORIDE 0.9 % IV SOLN: INTRAVENOUS | Status: DC | PRN

## 2018-08-05 MED ORDER — ACETAMINOPHEN 325 MG RE SUPP: 325.0000 mg | RECTAL | Status: DC | PRN

## 2018-08-05 MED ORDER — HYDRALAZINE HCL 20 MG/ML IJ SOLN
5.0000 mg | INTRAMUSCULAR | Status: DC | PRN
  Administered 2018-08-05: 5 mg via INTRAVENOUS
  Filled 2018-08-05: qty 1

## 2018-08-05 MED ORDER — SODIUM CHLORIDE 0.9% IV SOLUTION: Freq: Once | INTRAVENOUS | Status: DC

## 2018-08-05 MED ORDER — METOPROLOL TARTRATE 5 MG/5ML IV SOLN
2.0000 mg | INTRAVENOUS | Status: AC | PRN
  Administered 2018-08-06 (×2): 5 mg via INTRAVENOUS
  Filled 2018-08-05 (×2): qty 5

## 2018-08-05 MED ORDER — ORAL CARE MOUTH RINSE
15.0000 mL | OROMUCOSAL | Status: DC
  Administered 2018-08-05 – 2018-08-06 (×12): 15 mL via OROMUCOSAL

## 2018-08-05 MED ORDER — ROCURONIUM BROMIDE 10 MG/ML (PF) SYRINGE
PREFILLED_SYRINGE | INTRAVENOUS | Status: AC
  Filled 2018-08-05: qty 20

## 2018-08-05 MED ORDER — ONDANSETRON HCL 4 MG/2ML IJ SOLN: 4.0000 mg | Freq: Four times a day (QID) | INTRAMUSCULAR | Status: DC | PRN

## 2018-08-05 MED ORDER — SODIUM CHLORIDE 0.9% FLUSH
10.0000 mL | Freq: Two times a day (BID) | INTRAVENOUS | Status: DC
  Administered 2018-08-05 – 2018-08-10 (×10): 10 mL

## 2018-08-05 NOTE — Progress Notes (Signed)
  AAA Progress Note    08/05/2018 8:44 AM 1 Day Post-Op  Subjective:  Intubated/sedated  afebrile 409'W-119'J systolic HR 47'W-29'F NSR 93-98% .40 FiO2  Gtts: Propofol Precedex IVF  Vitals:   08/05/18 0820 08/05/18 0830  BP: (!) 126/56 131/61  Pulse: 69   Resp:  19  Temp:    SpO2:      Physical Exam: Cardiac:  regular Lungs:  Intubated on .40 FiO2;  Abdomen:  Soft; non distended Incisions:  Bandaged and is clean Extremities:  Easily palpable DP pulses bilaterally Neuro:  RN reports pt not responsive with weaning sedation but breathing becomes more labored.  Not much movement of extremities.  CBC    Component Value Date/Time   WBC 13.4 (H) 08/05/2018 0312   RBC 4.24 08/05/2018 0312   HGB 12.6 (L) 08/05/2018 0321   HCT 37.0 (L) 08/05/2018 0321   PLT 81 (L) 08/05/2018 0312   MCV 87.3 08/05/2018 0312   MCH 28.5 08/05/2018 0312   MCHC 32.7 08/05/2018 0312   RDW 15.7 (H) 08/05/2018 0312   LYMPHSABS 1.0 05/07/2012 2205   MONOABS 0.7 05/07/2012 2205   EOSABS 0.2 05/07/2012 2205   BASOSABS 0.0 05/07/2012 2205    BMET    Component Value Date/Time   NA 136 08/05/2018 0321   K 5.1 08/05/2018 0321   CL 101 08/05/2018 0312   CO2 24 08/05/2018 0312   GLUCOSE 122 (H) 08/05/2018 0312   BUN 31 (H) 08/05/2018 0312   CREATININE 3.21 (H) 08/05/2018 0312   CREATININE 1.31 02/21/2013 1622   CALCIUM 8.0 (L) 08/05/2018 0312   GFRNONAA 21 (L) 08/05/2018 0312   GFRAA 24 (L) 08/05/2018 0312    INR    Component Value Date/Time   INR 1.4 (H) 08/05/2018 0312     Intake/Output Summary (Last 24 hours) at 08/05/2018 0844 Last data filed at 08/05/2018 0800 Gross per 24 hour  Intake 12990.98 ml  Output 4205 ml  Net 8785.98 ml     Assessment/Plan:  53 y.o. male is s/p  Repair ruptured AAA 1 Day Post-Op  Vascular:  Easily palpable DP pulses bilaterally Cardiac:  NSR/hemodynamically stable Pulmonary:  Intubated with O2 sats of mid to upper 90's on .40 FiO2.  Wean  per critical care.   Appreciate CCM assistance.   RT giving breathing tx.  Neuro:  Non responsive but still on Propofol (dose has been cut in half) and Precedex.  Not moving extremities.  Will assess better once able to wean sedation more.  Heme/ID: acute surgical blood loss anemia-hgb stable at 12.6 this am.  He has received 6 units PRBC's, 4 units FFP and 1 platelet phoresis pack. Pt is afebrile.  Received Ancef periop.  Renal:  Creatinine improved since admission at 3.2, which is down from 4.8.  Good UOP.   Hyperkalemia-stable at 5.1.  Continue to monitor.     Leontine Locket, PA-C [facility] [phone] 08/05/2018 8:44 AM

## 2018-08-05 NOTE — Progress Notes (Signed)
Initial Nutrition Assessment  RD working remotely.  DOCUMENTATION CODES:   Not applicable  INTERVENTION:   Tube feeding recommendations: - Vital AF 1.2 @ 50 ml/hr (1200 ml/day) via NG tube - Pro-stat 30 ml BID  Tube feeding regimen provides 1640 kcal, 120 grams of protein, and 973 ml of H2O.   Tube feeding regimen and current propofol provides 2099 total kcal (99% of kcal needs).  NUTRITION DIAGNOSIS:   Inadequate oral intake related to inability to eat as evidenced by NPO status.  GOAL:   Patient will meet greater than or equal to 90% of their needs  MONITOR:   Labs, I & O's, Weight trends, Vent status, Skin  REASON FOR ASSESSMENT:   Ventilator    ASSESSMENT:   53 year old male who presented to the ED on 7/12 with abdominal pain. PMH of CHF, asthma, HTN. Abdominal CT showing ruptured infrarenal AAA with retroperitoneal blood.  7/13 - s/p repair of AAA, intubated  Discussed staring TF with CCM who would prefer to wait one more day. RD will leave TF recommendations.  NG tube in place, currently to low intermittent suction.  Unsure of pt's EDW. Weight of 100 kg recorded on 7/12 but weight of 145.2 kg recorded this AM. This weight appears to be stated rather than measured.  Spoke with RN who reports weight of 100 kg is likely most accurate given pt required 11 units of total blood products in the peri-op period. Pt with moderate pitting edema per RN documentation.  Patient is currently intubated on ventilator support MV: 9.9 L/min Temp (24hrs), Avg:98.4 F (36.9 C), Min:97.8 F (36.6 C), Max:98.6 F (37 C) BP (a-line): 120/61 MAP (a-line): 77  Drips: Propofol: 17.4 ml/hr (provides 459 kcal daily from lipid) Precedex: 10.9 ml/hr D5 in 1/2 NS: 125 ml/hr NS: 10 ml/hr  Medications reviewed and include: Colace, Protonix, IV abx  Labs reviewed.  UOP: 1630 ml x 24 hours EBL: 2500 ml x 24 hours I/O's: +9.1 L since admit  NUTRITION - FOCUSED PHYSICAL  EXAM:  Unable to complete at this time. RD working remotely.  Diet Order:   Diet Order            Diet NPO time specified  Diet effective now              EDUCATION NEEDS:   No education needs have been identified at this time  Skin:  Skin Assessment: Skin Integrity Issues: Incisions: closed incisions to right groin and abdomen  Last BM:  no documented BM  Height:   Ht Readings from Last 1 Encounters:  07/31/2018 6\' 2"  (1.88 m)    Weight:   Wt Readings from Last 1 Encounters:  08/05/18 (!) 145.2 kg   EDW: 100 kg  Ideal Body Weight:  86.4 kg  BMI:  Body mass index is 41.09 kg/m.  Estimated Nutritional Needs:   Kcal:  2120  Protein:  115-130 grams  Fluid:  >/= 2.0 L    Gaynell Face, MS, RD, LDN Inpatient Clinical Dietitian Pager: [phone] Weekend/After Hours: [phone]

## 2018-08-05 NOTE — Progress Notes (Signed)
PT Cancellation Note  Patient Details Name: Brandon Snyder MRN: [id number] DOB: 06/30/1965   Cancelled Treatment:    Reason Eval/Treat Not Completed: Medical issues which prohibited therapy(Pt in surgery early am. Intubated and sedated. HOLD today)   Denice Paradise 08/05/2018, 12:22 PM Mackinley Kiehn,PT Acute Rehabilitation Services Pager:  [phone]  Office:  [phone]

## 2018-08-05 NOTE — Progress Notes (Signed)
RT placed ETT on patient. No complications. Vitals stable throughout. Patient tolerated well. RT will continue to monitor.

## 2018-08-05 NOTE — Progress Notes (Signed)
[facility]  Subjective  - follows commands per nursing   Objective 121/71 69 98.6 F (37 C) (Oral) 18 96%  Intake/Output Summary (Last 24 hours) at 08/05/2018 1106 Last data filed at 08/05/2018 1100 Gross per 24 hour  Intake 13636.85 ml  Output 4430 ml  Net 9206.85 ml   1+ DP right foot, 2+ DP left foot Abdomen soft Urine output adequate >100/hr  Assessment/Planning: POD #0 s/p ruptured AAA Fluid today as I am sure he will third space Kidney function was not normal prior to OR so keep ahead on fluid to prevent further ATN Will recheck BMET and [place] 08/05/2018 11:06 AM --  Laboratory Lab Results: Recent Labs    Aug 27, 2018 1628  08/05/18 0312 08/05/18 0321 08/05/18 1021  WBC 10.7*  --  13.4*  --   --   HGB 9.5*   < > 12.1* 12.6* 11.6*  HCT 32.1*   < > 37.0* 37.0* 34.0*  PLT 157  --  81*  --   --    < > = values in this interval not displayed.   BMET Recent Labs    08/27/2018 1628  08/05/18 0312 08/05/18 0321 08/05/18 1021  NA 136   < > 134* 136 136  K 4.3   < > 5.0 5.1 4.3  CL 97*  --  101  --   --   CO2 27  --  24  --   --   GLUCOSE 120*  --  122*  --   --   BUN 36*  --  31*  --   --   CREATININE 4.81*  --  3.21*  --   --   CALCIUM 8.5*  --  8.0*  --   --    < > = values in this interval not displayed.    COAG Lab Results  Component Value Date   INR 1.4 (H) 08/05/2018   No results found for: PTT

## 2018-08-05 NOTE — Progress Notes (Signed)
Patient received from OR, placed on monitor, post op, monitoring as per protocol.

## 2018-08-05 NOTE — Transfer of Care (Signed)
Immediate Anesthesia Transfer of Care Note  Patient: Brandon Snyder  Procedure(s) Performed: Ruptured ABDOMINAL ANEURYSM  REPAIR WITH HEMASHIELD GOLD VASCULAR GRAFT 18MM (N/A Abdomen) Ultrasound Guidance For Vascular Access WITH AORTIC BALLOON INSERTION (Right Groin)  Patient Location: ICU  Anesthesia Type:General  Level of Consciousness: sedated and Patient remains intubated per anesthesia plan  Airway & Oxygen Therapy: Patient remains intubated per anesthesia plan and Patient placed on Ventilator (see vital sign flow sheet for setting)  Post-op Assessment: Report given to RN and Post -op Vital signs reviewed and stable  Post vital signs: Reviewed and stable  Last Vitals:  Vitals Value Taken Time  BP 146/76 08/05/18 0242  Temp    Pulse 81 08/05/18 0240  Resp 12 08/05/18 0249  SpO2 89 % 08/05/18 0240  Vitals shown include unvalidated device data.  Last Pain:  Vitals:   08/20/2018 1718  TempSrc:   PainSc: 4          Complications: No apparent anesthesia complications

## 2018-08-05 NOTE — Progress Notes (Addendum)
NAME:  Brandon ConnersDamon L Snyder, MRN:  [id number], DOB:  1965/08/02, LOS: 1 ADMISSION DATE:  08/10/2018, CONSULTATION DATE:  08/05/18 REFERRING MD:  Dr. Darrick PennaFields , CHIEF COMPLAINT:  Ruptured abdominal Aortic Aneurysm  Brief History   This is a 53 yo with history of asthma, and CHF for who we are consulted sp repair of ruptured abdominal aortic aneurysm. 11 units of total blood products in the peri-op period.   Past Medical History  HFrEF HTN Asthma  Significant Hospital Events   Abdominal Aortic rupture repair on 08/05/18  Consults:  NA  Procedures:  Abdominal Aortic rupture repair on 08/05/18  Significant Diagnostic Tests:  CT abdomen as noted above  Micro Data:  NA SARS COV2 negative  Antimicrobials:  NA  Interim history/subjective:  No acute events since prior evaluated. 11 total units of blood products so far. CVL 22. Deeply sedated.   Objective   Blood pressure 112/63, pulse 69, temperature 98.6 F (37 C), temperature source Oral, resp. rate 19, height 6\' 2"  (1.88 m), weight (!) 145.2 kg, SpO2 96 %. CVP:  [23 mmHg] 23 mmHg  Vent Mode: PRVC FiO2 (%):  [40 %-100 %] 40 % Set Rate:  [14 bmp-18 bmp] 18 bmp Vt Set:  [500 mL-650 mL] 500 mL PEEP:  [5 cmH20] 5 cmH20 Plateau Pressure:  [24 cmH20] 24 cmH20   Intake/Output Summary (Last 24 hours) at 08/05/2018 [id number] Last data filed at 08/05/2018 0900 Gross per 24 hour  Intake 13259.12 ml  Output 4280 ml  Net 8979.12 ml   Filed Weights   07/31/2018 2105 08/05/18 0226  Weight: 100 kg (!) 145.2 kg    Examination: General: overweight middle aged male in NAD HENT: Eyes equally round and reactive Lungs: Faint wheeze Cardiovascular: RRR no MRG Abdomen: midline longitudinal surgical dressing CDI Extremities: No deformities noted  Neuro: RASS -4 GU: Foley in place draining blood tinged urine.   Resolved Hospital Problem list   NA  Assessment & Plan:  This is a 53 yo with history as noted above who presented with abdominal pain  noted to have ruptured abdominal aortic aneurysm.   Acute hypercarbic respiratory failure in the perioperative period  Asthma without acute exacerbation.  - Increase TV to 8cc/kg - Suspect extubation should be achievable soon. Cannot currently tolerate sedation wean. I suspect this is due to inadequate pain management. PRN morphine ordered. RN instructed to prioritize this.  - ABG in an hour.   Hypotension likely sedation related, seems to improve when propofol weaned.  - hopefully with pain control as above we can wean prop further. Bowel regimen.  - Volume up for admission, will attempt to diurese on improved.   Acute metabolic encephalopathy.  - Propofol and Precedex for RASS goal -1 to -2.  - Wean dex to off if able.   AKI:  -Follow urine lytes and output -Fluids per primary team  Best practice:  Diet: NPO for now Pain/Anxiety/Delirium protocol (if indicated): Patient with Mrophine prn for pain, also has precedex and propofol for sedation VAP protocol (if indicated): HOB 30 degrees DVT prophylaxis: Hold for now as recent surgery and rupture  GI prophylaxis: Do not anticipate patient will need as will likely be extubated soon Glucose control:  Mobility: PT when extubated  Code Status: Per primary is full Family Communication: Primary has communicated post op Disposition: CVICU for now  Labs   CBC: Recent Labs  Lab 08/02/2018 1628  08/17/2018 2248 08/17/2018 2347 08/05/18 0119 08/05/18 [id number] 08/05/18 0321  WBC 10.7*  --   --   --   --  13.4*  --   HGB 9.5*   < > 8.2* 10.2* 11.9* 12.1* 12.6*  HCT 32.1*   < > 24.0* 30.0* 35.0* 37.0* 37.0*  MCV 87.9  --   --   --   --  87.3  --   PLT 157  --   --   --   --  81*  --    < > = values in this interval not displayed.    Basic Metabolic Panel: Recent Labs  Lab August 14, 2018 1628  Aug 14, 2018 2248 08/14/18 2347 08/05/18 0119 08/05/18 0312 08/05/18 0321  NA 136   < > 136 135 134* 134* 136  K 4.3   < > 4.5 5.1 5.4* 5.0 5.1  CL 97*   --   --   --   --  101  --   CO2 27  --   --   --   --  24  --   GLUCOSE 120*  --   --   --   --  122*  --   BUN 36*  --   --   --   --  31*  --   CREATININE 4.81*  --   --   --   --  3.21*  --   CALCIUM 8.5*  --   --   --   --  8.0*  --   MG  --   --   --   --   --  1.8  --    < > = values in this interval not displayed.   GFR: Estimated Creatinine Clearance: 40.9 mL/min (A) (by C-G formula based on SCr of 3.21 mg/dL (H)). Recent Labs  Lab 14-Aug-2018 1628 08/05/18 0312  WBC 10.7* 13.4*    Liver Function Tests: Recent Labs  Lab 08-14-2018 1628 08/05/18 0312  AST 17 39  ALT 20 35  ALKPHOS 57 38  BILITOT 1.0 5.1*  PROT 6.1* 4.8*  ALBUMIN 3.5 3.0*   Recent Labs  Lab 08/14/2018 1628 08/05/18 0312  LIPASE 21  --   AMYLASE  --  101*   No results for input(s): AMMONIA in the last 168 hours.  ABG    Component Value Date/Time   PHART 7.311 (L) 08/05/2018 0321   PCO2ART 52.5 (H) 08/05/2018 0321   PO2ART 103.0 08/05/2018 0321   HCO3 26.7 08/05/2018 0321   TCO2 28 08/05/2018 0321   ACIDBASEDEF 3.0 (H) 08/05/2018 0119   O2SAT 97.0 08/05/2018 0321     Coagulation Profile: Recent Labs  Lab 08/05/18 0312  INR 1.4*    Cardiac Enzymes: No results for input(s): CKTOTAL, CKMB, CKMBINDEX, TROPONINI in the last 168 hours.  HbA1C: No results found for: HGBA1C  CBG: No results for input(s): GLUCAP in the last 168 hours.  Review of Systems:   As noted in HPI  Past Medical History  He,  has a past medical history of Asthma, CHF (congestive heart failure) ([place]), Hypertension, and Pneumonia.   Surgical History   History reviewed. No pertinent surgical history.   Social History   reports that he has never smoked. He has never used smokeless tobacco. He reports current alcohol use. He reports that he does not use drugs.   Family History   His family history includes Cancer in his mother.   Allergies No Known Allergies   Home Medications  Prior to Admission  medications  Medication Sig Start Date End Date Taking? Authorizing Provider  dicyclomine (BENTYL) 10 MG capsule Take 10 mg by mouth 3 (three) times daily as needed for muscle spasms. 08/03/18  Yes [provider]  naproxen (NAPROSYN) 500 MG tablet Take 500 mg by mouth 2 (two) times daily as needed for pain. 07/25/18  Yes [provider]  ondansetron (ZOFRAN-ODT) 4 MG disintegrating tablet Take 4 mg by mouth 2 (two) times daily as needed for nausea. 08/03/18  Yes [provider]  traMADol (ULTRAM) 50 MG tablet Take 50 mg by mouth 2 (two) times daily as needed for pain. 07/09/18  Yes [provider]  carvedilol (COREG) 12.5 MG tablet Take 2 tablets (25 mg total) by mouth 2 (two) times daily. PATIENT NEEDS OFFICE VISIT FOR ADDITIONAL REFILLS Patient not taking: Reported on 01-28-2018 12/23/13   Hamilton CapriLe, Thao P, DO  furosemide (LASIX) 40 MG tablet Take 1 tablet (40 mg total) by mouth 2 (two) times daily. 1 tab twice daily. Patient not taking: Reported on 01-28-2018 02/21/13   Le, Thao P, DO  isosorbide-hydrALAZINE (BIDIL) 20-37.5 MG per tablet Take 1 tablet by mouth 3 (three) times daily. Patient not taking: Reported on 01-28-2018 02/21/13   Le, Thao P, DO  potassium chloride (KLOR-CON) 20 MEQ packet Take 40 mEq by mouth daily. Patient not taking: Reported on 01-28-2018 02/21/13   Lenell AntuLe, Thao P, DO     Critical care time: 60 min     Joneen RoachPaul Hoffman, AGACNP-BC [facility] Pager [phone] or [phone]  08/05/2018 9:39 AM  Attending Note:  53 year old male s/p AAA repair presenting to PCCM with VDRF post op.  PCCM consulted for vent management.  Sedate this AM with clear lungs on exam.  I reviewed CXR myself, ETT is in a good position.  Discussed with PCCM-NP.  Will maintain on full vent support.  Adjust vent for ABG.  Propofol and fentanyl for pain.  Hold off TF.  D/C diuretics.  BMET in AM.  PCCM will continue to follow.  The patient is critically ill  with multiple organ systems failure and requires high complexity decision making for assessment and support, frequent evaluation and titration of therapies, application of advanced monitoring technologies and extensive interpretation of multiple databases.   Critical Care Time devoted to patient care services described in this note is  40  Minutes. This time reflects time of care of this signee Dr Koren BoundWesam Davielle Lingelbach. This critical care time does not reflect procedure time, or teaching time or supervisory time of PA/NP/Med student/Med Resident etc but could involve care discussion time.  Alyson ReedyWesam G. Molina Hollenback, M.D. [facility]Critical [facility] Medicine. Pager: [phone]. After hours pager: [phone].

## 2018-08-05 NOTE — Progress Notes (Signed)
OT Cancellation Note  Patient Details Name: Brandon Snyder MRN: [id number] DOB: 04/13/1965   Cancelled Treatment:    Reason Eval/Treat Not Completed: Patient not medically ready.  Will see as appropriate and able.   Delight Stare, OT Acute Rehabilitation Services Pager [phone] Office [phone]    Delight Stare 08/05/2018, 11:05 AM

## 2018-08-05 NOTE — Progress Notes (Signed)
Patient relatively stable in ICU.  Blood pressure 676 systolic  Urine output about 100 cc last hour  Abdomen obese not tense soft  Extremities: Feet warm  Review of labs shows thrombocytopenia with platelet count of 80 PT is still slightly elevated both suggesting coagulopathy will transfuse 2 units of FFP and a sixpack of platelets  Patient's creatinine is 3 slightly down from 4 most likely this was prerenal from prior to his operation.  Continue large volume fluid resuscitation to hydrate his kidney  Hemoglobin is 12 which again suggests either some hemoconcentration and volume needs certainly does not seem to have ongoing bleeding currently has hemoglobin has now been stable for about 2-1/2 hours  Ruta Hinds, MD [facility]: [phone] Pager: [phone]

## 2018-08-05 NOTE — Progress Notes (Signed)
eLink Physician-Brief Progress Note Patient Name: NATHANYAL ASHMEAD DOB: [birth year] MRN: [id number]   Date of Service  08/05/2018  HPI/Events of Note   53 y/o M history of HTn presented with abdominal pain found to have a ruptured infrarenal AAA underwent emergent repair. EBL 2 L, Cellsaver used. Transfused 6 units PRBC and 2 units FFP. Infrarenal clamp 30 minutes.  eICU Interventions   Continue to monitor UO, currently adequate  Watch out for signs of bleed.     Intervention Category Evaluation Type: New Patient Evaluation  Judd Lien 08/05/2018, 4:26 AM

## 2018-08-05 NOTE — Consult Note (Addendum)
NAME:  Brandon Snyder, MRN:  [id number], DOB:  17-Dec-1965, LOS: 1 ADMISSION DATE:  08/02/2018, CONSULTATION DATE:  08/05/18 REFERRING MD:  Dr. Oneida Alar , CHIEF COMPLAINT:  Ruptured abdominal Aortic Aneurysm  Brief History   This is a 53 yo with history of asthma, and CHF for who we are consulted sp repair of ruptured abdominal aortic aneurysm.   History of present illness   53 yo presented with 2 days of abdomoinal pain that was noted in the RLQ. Initally went to urgent care and told it was gastroenteritis. Came into Ed with continued complaints. In the ED labs andimaging pertinent for the following.   Negative COVID Normal LFT's and Lipase Creatinine 4.81-Last Creatinine was 1.3 in 2015 CT abdomen noting Large volume hemorrhage within the right retroperitoneum contiguos with the right posterior wass of a 9 cm infrarenal abdominal aortic aneurysm. Also noting right lung base consolidation consistent with atelectasis.   Patient taken to surgery. For repair. EBL was 2 liters. Cellsaver used, transfused 6 units PRBC's and 2 units PRBC. Infrarenal clamp 30 minutes. Also received 4 bolus's of albumin. Did not need pressors.       Past Medical History  HFrEF HTN Asthma  Significant Hospital Events   Abdominal Aortic rupture repair on 08/05/18  Consults:  NA  Procedures:  Abdominal Aortic rupture repair on 08/05/18  Significant Diagnostic Tests:  CT abdomen as noted above  Micro Data:  NA SARS COV2 negative  Antimicrobials:  NA  Interim history/subjective:  Patient admitted sp repair with minima vent settings and no pressor requirment. Weaning sedation gently as tolerated.   Objective   Blood pressure 105/72, pulse 96, temperature 98.2 F (36.8 C), temperature source Oral, resp. rate (!) 23, height 6\' 2"  (1.88 m), weight (!) 145.2 kg, SpO2 94 %.        Intake/Output Summary (Last 24 hours) at 08/05/2018 0233 Last data filed at 08/05/2018 0201 Gross per 24 hour  Intake 11007  ml  Output 3355 ml  Net 7652 ml   Filed Weights   08/02/2018 2105 08/05/18 0226  Weight: 100 kg (!) 145.2 kg    Examination: General: Patient sedated and intubated  HENT: Eyes equally round and reactive Lungs: CTAB Cardiovascular: RRR Abdomen: Incision noted on abdomen Extremities: No deformities noted  Neuro: RASS -4 GU: Foley in places  Resolved Hospital Problem list   NA  Assessment & Plan:  This is a 53 yo with history as noted above who presented with abdominal pain noted to have ruptured abdominal aortic aneurysm. History of decreased intake and   Cardiac-Has received an appropriately generous amount of volume intra op. So far no sign of overload as oxygenation normal and no edema appreciated -Continue to be judicious with fluids. Can consider diuresis in future if indicated.  Pulmonary-Peep 5 Fio2 40%. Oxygenation reassuring lungs on CT imaging does show some atelectasis -PRVC for now. TV 500. Ideal body weight around 80Kg. Thus this is close to 6cc/kg goal -Please attain gas and will attempt to help adjust vent setting -Suspect extubation should be achievable soon.  -PS trial at 5/5 when able before extubation for 30 minutes. If passes and follows commands should be able to extubate.  Neuro-Patient sedated at this time -Will reassess when he wakes up  GI-Patient will be on high does narcotics for pain. Patient with belief he had gastroenteritis beforehand -Will ensure patient has bowel regimen prn   GU-Patient significant elevation in creatinine. Looking at [facility] in 2014 patient  with creatinine elevated at 1.45 and in 2015 it was 1.31. Will send urine lytes and UA to help understand further delineats. BUN: creatinine ration would suggest this is likely intrinsic.  -Follow urine lytes and output -Fluids per primary team     Best practice:  Diet: NPO for now Pain/Anxiety/Delirium protocol (if indicated): Patient with Mrophine prn for pain, also has precedex and  propofol for sedation VAP protocol (if indicated): HOB 30 degrees DVT prophylaxis: Hold for now as recent surgery and rupture  GI prophylaxis: Do not anticipate patient will need as will likely be extubated soon Glucose control:  Mobility: PT when extubated  Code Status: Per primary is full Family Communication: Primary has communicated post op Disposition: CVICU for now  Labs   CBC: Recent Labs  Lab 07/31/2018 1628 07/26/2018 2049 08/16/2018 2248 08/09/2018 2347 08/05/18 0119  WBC 10.7*  --   --   --   --   HGB 9.5* 10.2* 8.2* 10.2* 11.9*  HCT 32.1* 30.0* 24.0* 30.0* 35.0*  MCV 87.9  --   --   --   --   PLT 157  --   --   --   --     Basic Metabolic Panel: Recent Labs  Lab 08/06/2018 1628 08/17/2018 2049 07/31/2018 2248 07/24/2018 2347 08/05/18 0119  NA 136 134* 136 135 134*  K 4.3 4.7 4.5 5.1 5.4*  CL 97*  --   --   --   --   CO2 27  --   --   --   --   GLUCOSE 120*  --   --   --   --   BUN 36*  --   --   --   --   CREATININE 4.81*  --   --   --   --   CALCIUM 8.5*  --   --   --   --    GFR: Estimated Creatinine Clearance: 27.3 mL/min (A) (by C-G formula based on SCr of 4.81 mg/dL (H)). Recent Labs  Lab 08/18/2018 1628  WBC 10.7*    Liver Function Tests: Recent Labs  Lab 08/03/2018 1628  AST 17  ALT 20  ALKPHOS 57  BILITOT 1.0  PROT 6.1*  ALBUMIN 3.5   Recent Labs  Lab 08/09/2018 1628  LIPASE 21   No results for input(s): AMMONIA in the last 168 hours.  ABG    Component Value Date/Time   PHART 7.318 (L) 08/05/2018 0119   PCO2ART 46.0 08/05/2018 0119   PO2ART 162.0 (H) 08/05/2018 0119   HCO3 23.9 08/05/2018 0119   TCO2 25 08/05/2018 0119   ACIDBASEDEF 3.0 (H) 08/05/2018 0119   O2SAT 99.0 08/05/2018 0119     Coagulation Profile: No results for input(s): INR, PROTIME in the last 168 hours.  Cardiac Enzymes: No results for input(s): CKTOTAL, CKMB, CKMBINDEX, TROPONINI in the last 168 hours.  HbA1C: No results found for: HGBA1C  CBG: No results for  input(s): GLUCAP in the last 168 hours.  Review of Systems:   As noted in HPI  Past Medical History  He,  has a past medical history of Asthma, CHF (congestive heart failure) ([facility]), Hypertension, and Pneumonia.   Surgical History   History reviewed. No pertinent surgical history.   Social History   reports that he has never smoked. He has never used smokeless tobacco. He reports current alcohol use. He reports that he does not use drugs.   Family History   His family history  includes Cancer in his mother.   Allergies No Known Allergies   Home Medications  Prior to Admission medications   Medication Sig Start Date End Date Taking? Authorizing Provider  dicyclomine (BENTYL) 10 MG capsule Take 10 mg by mouth 3 (three) times daily as needed for muscle spasms. 08/03/18  Yes [provider]  naproxen (NAPROSYN) 500 MG tablet Take 500 mg by mouth 2 (two) times daily as needed for pain. 07/25/18  Yes [provider]  ondansetron (ZOFRAN-ODT) 4 MG disintegrating tablet Take 4 mg by mouth 2 (two) times daily as needed for nausea. 08/03/18  Yes [provider]  traMADol (ULTRAM) 50 MG tablet Take 50 mg by mouth 2 (two) times daily as needed for pain. 07/09/18  Yes [provider]  carvedilol (COREG) 12.5 MG tablet Take 2 tablets (25 mg total) by mouth 2 (two) times daily. PATIENT NEEDS OFFICE VISIT FOR ADDITIONAL REFILLS Patient not taking: Reported on 08/17/2018 12/23/13   Hamilton CapriLe, Thao P, DO  furosemide (LASIX) 40 MG tablet Take 1 tablet (40 mg total) by mouth 2 (two) times daily. 1 tab twice daily. Patient not taking: Reported on 08/17/2018 02/21/13   Le, Thao P, DO  isosorbide-hydrALAZINE (BIDIL) 20-37.5 MG per tablet Take 1 tablet by mouth 3 (three) times daily. Patient not taking: Reported on 08/02/2018 02/21/13   Le, Thao P, DO  potassium chloride (KLOR-CON) 20 MEQ packet Take 40 mEq by mouth daily. Patient not taking: Reported on 08/23/2018 02/21/13   Hamilton CapriLe, Thao P, DO      Critical care time: 60 min

## 2018-08-05 NOTE — Plan of Care (Signed)

## 2018-08-05 NOTE — H&P (Signed)
Referring Physician: Dr. Al[facility]  Patient name: Brandon Snyder MRN: [id number] DOB: 06-10-1965 Sex: male  REASON FOR CONSULT: Ruptured abdominal aortic aneurysm  HPI: Brandon Snyder is a 53 y.o. male, with a several day history of abdominal pain.  He had had some nausea and vomiting.  This was initially thought to be gastroenteritis.  He had no fever.  The abdominal pain continued to get worse.  He presented to the [facility] long ER.  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  Other medical problems include hypertension which is controlled.  He has no history of tobacco abuse.  He has no family history of abdominal aortic aneurysm.  He does have a history of congestive heart failure.  Echocardiogram in 2014 showed an ejection fraction 30 to 35%.  The patient was transported by ambulance to [facility].  He was hemodynamically stable during transport according to EMS.  He was noted to have a preoperative creatinine of 4.8.  The patient does not know of any prior history of renal dysfunction.  Past Medical History:  Diagnosis Date  . Asthma   . CHF (congestive heart failure) ([place])   . Hypertension   . Pneumonia    History reviewed. No pertinent surgical history.  Family History  Problem Relation Age of Onset  . Cancer Mother     SOCIAL HISTORY: Social History   Socioeconomic History  . Marital status: Married    Spouse name: Not on file  . Number of children: Not on file  . Years of education: Not on file  . Highest education level: Not on file  Occupational History  . Not on file  Social Needs  . Financial resource strain: Not on file  . Food insecurity    Worry: Not on file    Inability: Not on file  . Transportation needs    Medical: Not on file    Non-medical: Not on file  Tobacco Use  . Smoking status: Never Smoker  . Smokeless tobacco: Never Used  Substance and Sexual Activity  . Alcohol use: Yes    Comment:  occasional  . Drug use: No  . Sexual activity: Not on file  Lifestyle  . Physical activity    Days per week: Not on file    Minutes per session: Not on file  . Stress: Not on file  Relationships  . Social Herbalist on phone: Not on file    Gets together: Not on file    Attends religious service: Not on file    Active member of club or organization: Not on file    Attends meetings of clubs or organizations: Not on file    Relationship status: Not on file  . Intimate partner violence    Fear of current or ex partner: Not on file    Emotionally abused: Not on file    Physically abused: Not on file    Forced sexual activity: Not on file  Other Topics Concern  . Not on file  Social History Narrative  . Not on file    No Known Allergies  No current facility-administered medications on file prior to encounter.    Current Outpatient Medications on File Prior to Encounter  Medication Sig Dispense Refill  . dicyclomine (BENTYL) 10 MG capsule Take 10 mg by mouth 3 (three) times daily as needed for muscle spasms.    . naproxen (  NAPROSYN) 500 MG tablet Take 500 mg by mouth 2 (two) times daily as needed for pain.    Marland Kitchen. ondansetron (ZOFRAN-ODT) 4 MG disintegrating tablet Take 4 mg by mouth 2 (two) times daily as needed for nausea.    . traMADol (ULTRAM) 50 MG tablet Take 50 mg by mouth 2 (two) times daily as needed for pain.    . carvedilol (COREG) 12.5 MG tablet Take 2 tablets (25 mg total) by mouth 2 (two) times daily. PATIENT NEEDS OFFICE VISIT FOR ADDITIONAL REFILLS (Patient not taking: Reported on 2018/09/30) 60 tablet 0  . furosemide (LASIX) 40 MG tablet Take 1 tablet (40 mg total) by mouth 2 (two) times daily. 1 tab twice daily. (Patient not taking: Reported on 2018/09/30) 180 tablet 1  . isosorbide-hydrALAZINE (BIDIL) 20-37.5 MG per tablet Take 1 tablet by mouth 3 (three) times daily. (Patient not taking: Reported on 2018/09/30) 270 tablet 1  . potassium chloride (KLOR-CON) 20  MEQ packet Take 40 mEq by mouth daily. (Patient not taking: Reported on 2018/09/30) 90 tablet 1    ROS:   Unable to obtain due to emergency case  Physical Examination  Vitals:   2019/01/12 1810 2019/01/12 1943 2019/01/12 2105 08/05/18 0226  BP: 107/72 105/72    Pulse: 89 96    Resp: (!) 21 (!) 23    Temp:      TempSrc:      SpO2: 96% 94%    Weight:   100 kg (!) 145.2 kg  Height:   6\' 2"  (1.88 m)     Body mass index is 41.09 kg/m.  General:  Alert and oriented, appears uncomfortable  HEENT: Normal Neck: No JVD Pulmonary: Clear to auscultation bilaterally Cardiac: Regular Rate and Rhythm  Abdomen: Soft, non-tender, mild diffuse tenderness obese  skin: No rash Extremity Pulses:  2+ radial, brachial, femoral, absent dorsalis pedis, posterior tibial pulses bilaterally Musculoskeletal: No deformity or edema  Neurologic: Upper and lower extremity motor 5/5 and symmetric  DATA:  CBC    Component Value Date/Time   WBC 10.7 (H) 02020/09/07 1628   RBC 3.65 (L) 02020/09/07 1628   HGB 11.9 (L) 08/05/2018 0119   HCT 35.0 (L) 08/05/2018 0119   PLT 157 02020/09/07 1628   MCV 87.9 02020/09/07 1628   MCH 26.0 02020/09/07 1628   MCHC 29.6 (L) 02020/09/07 1628   RDW 16.5 (H) 02020/09/07 1628   LYMPHSABS 1.0 05/07/2012 2205   MONOABS 0.7 05/07/2012 2205   EOSABS 0.2 05/07/2012 2205   BASOSABS 0.0 05/07/2012 2205    BMET    Component Value Date/Time   NA 134 (L) 08/05/2018 0119   K 5.4 (H) 08/05/2018 0119   CL 97 (L) 02020/09/07 1628   CO2 27 02020/09/07 1628   GLUCOSE 120 (H) 02020/09/07 1628   BUN 36 (H) 02020/09/07 1628   CREATININE 4.81 (H) 02020/09/07 1628   CREATININE 1.31 02/21/2013 1622   CALCIUM 8.5 (L) 02020/09/07 1628   GFRNONAA 13 (L) 02020/09/07 1628   GFRAA 15 (L) 02020/09/07 1628   CT scan non contrast Ruptured infrarenal AAA reverse tapered neck which I do not feel is amenable to stent graft repair there is also anterior tortuosity.  Patient also appears to have bilateral  common iliac aneurysms but it is difficult to determine due to the noncontrast CT scan  ASSESSMENT: Ruptured abdominal aortic aneurysm.  We will proceed with balloon occlusion via right femoral approach but open repair due to short angulated neck and possible iliac aneurysms  PLAN: See above   Fabienne Brunsharles Fields, MD [facility] [place] Office: [phone] Pager: [phone]

## 2018-08-05 NOTE — Progress Notes (Signed)
Assisted tele visit to patient with family member.  Allsion Nogales Ann, RN  

## 2018-08-05 NOTE — Op Note (Signed)
Procedure: Repair of ruptured abdominal aortic aneurysm (18 mm straight dacron graft)  Preoperative diagnosis: Ruptured abdominal aortic aneurysm  Postoperative diagnosis: Same  Anesthesia: General  Assistant: Liana Crocker, PA-C  Operative findings: #1 right femoral 12 French dry seal sheath placed for possible balloon vascular control  2.  18 mm Dacron graft  3.  Infrarenal aortic clamp  Operative details: After obtaining informed consent, the patient was taken the operating.  The patient was placed in supine position operating table.  The anesthesia team placed arterial lines and central venous lines and a Foley catheter and nasogastric tube were placed in the operating room 16.  After all of these were in place, the patient was prepped and draped in usual sterile fashion from the nipples to the knees.  Next ultrasound was used to locate the right common femoral artery.  Local anesthesia was infiltrated over this.  An introducer needle was used to cannulate the right common femoral artery and an 035 Bentson wire advanced up in the abdominal aorta under fluoroscopic guidance.  A 5 French sheath was placed over the guidewire in the right common femoral artery.  This was used to dilate the tract.  Next 2 pro glides were fired 1 in the 3:00 and 1 in the 9 o'clock position.  Next a 12 French dry seal sheath was brought up in the operative field.  The Bentson wire was swapped out over a Berenstein catheter for an 035 Amplatz wire.  The 12 French sheath was then advanced up into the mid abdominal aorta over the Amplatz wire.  A Gore aortic balloon was then advanced over the guidewire and positioned at the L1 vertebral body.  This was not inflated but was kept in place and pick case the patient became hemodynamically unstable.  At this point a midline laparotomy incision was performed extending from the xiphoid to the pubis.  Incision was carried into subcutaneous tissues and down through the fascia.   Upon entering the abdomen the small bowel was reflected to the right greater omentum and transverse colon reflected superiorly.  Omni retractor was brought up in the operative field for assistance in retraction.  Patient had a very large infrarenal abdominal aortic aneurysm and large retroperitoneal hematoma.  I began dissecting into the retroperitoneum and carried this up to the level of the left renal vein.  This was retracted superiorly.  Using blunt and cautery technique I was able to get down to the aortic neck and placed a clamp across this after pulling the guidewire and balloon and sheath down into the right iliac system.  Patient was given 10,000 units of intravenous heparin.  He was given an additional similar dose during the case for a total of 20,000 units of heparin.  Next dissection was carried down to the level of the right common iliac artery.  This was of normal caliber it was dissected free is on its anterior two thirds surface and clamped with a car clamp.  The left common iliac artery was slightly aneurysmal.  I was able to dissect out the anterior two thirds surface of this and also place another core clamp.  The aneurysm was then entered.  There was vigorous bleeding from multiple lumbar arteries.  These were controlled with figure-of-eight silk sutures or clips externally.  The inferior mesenteric artery was ligated.  The aorta was transected about 3 cm below the renal arteries and an 18 mm Dacron graft brought up on the operative field and sewn end-to-end to  the graft using a running 3-0 Prolene suture.  A completion anastomosis it was tested the suture line was slightly loose so this was tightened up and one additional Prolene suture was placed with a pledget.  This secured hemostasis.  Attention was then turned to the distal anastomosis.  The patient's rupture had occurred in the distal abdominal aorta just above the bifurcation to the left retroperitoneum.  The posterior wall of the aorta  at the bifurcation was fairly friable and thinned out.  However, I did think this was sewable.  The graft was cut to length and the posterior wall was then constructed using a 3 interrupted 3-0 Prolene sutures with a felt strip.  Just prior to completion anastomosis it was for blood backbled and thoroughly flushed.  Anastomosis was secured clamps released there was still some bleeding from the postero-left lateral wall.  An additional pledgeted Prolene suture was placed in this.  There is still a small amount of oozing from the very thinned out portion some Gelfoam was placed on this and pressure was held for about 10 minutes.  The patient was also given 100 mg of protamine.  The 12 French sheath was removed from the right groin and the pro glides secured.  There was good hemostasis.  The insertion site was closed with a Vicryl stitch.  There was still some oozing from the retroperitoneal structures but overall the anastomotic suture lines seemed hemostatic.  The aortic sac was closed with a running 3-0 Vicryl suture.  The viscera were inspected and found to be healthy in appearance.  They were all pink.  Transverse and descending colon was pink and healthy and viable appearing.  The viscera were returned to their normal position.  The fascia was reapproximated with a running #1 PDS suture.  The skin was closed with staples.  The patient tolerated the procedure well with only some mild hemodynamic instability during the portion of the case where we were repairing the lumbar arteries and also after unclamping the proximal clamp with the patient briefly had some hypotension with a systolic pressure in the 80s.  At the conclusion of the case he was with a systolic pressure of 120-140.  Instrument sponge and needle counts were correct the end of the case.  The patient was taken in critical but stable condition to the ICU.  Pt friend Shana updated at [phone]  Fabienne BrunsharleEdson Snowballs Olga Seyler, MD [facility]  [place] Office: [phone] Pager: [phone]

## 2018-08-06 ENCOUNTER — Inpatient Hospital Stay (HOSPITAL_COMMUNITY): Payer: 59

## 2018-08-06 LAB — BPAM PLATELET PHERESIS
Blood Product Expiration Date: 202007142359
ISSUE DATE / TIME: 202007130619
Unit Type and Rh: 6200

## 2018-08-06 LAB — GLUCOSE, CAPILLARY
Glucose-Capillary: 108 mg/dL — ABNORMAL HIGH (ref 70–99)
Glucose-Capillary: 69 mg/dL — ABNORMAL LOW (ref 70–99)
Glucose-Capillary: 111 mg/dL — ABNORMAL HIGH (ref 70–99)
Glucose-Capillary: 100 mg/dL — ABNORMAL HIGH (ref 70–99)
Glucose-Capillary: 93 mg/dL (ref 70–99)

## 2018-08-06 LAB — BPAM FFP
Blood Product Expiration Date: 202007132359
Blood Product Expiration Date: 202007162359
ISSUE DATE / TIME: 202007130441
ISSUE DATE / TIME: 202007130441
Unit Type and Rh: 6200
Unit Type and Rh: 6200

## 2018-08-06 LAB — PREPARE FRESH FROZEN PLASMA
Unit division: 0
Unit division: 0

## 2018-08-06 LAB — BASIC METABOLIC PANEL
Anion gap: 7 (ref 5–15)
BUN: 18 mg/dL (ref 6–20)
CO2: 26 mmol/L (ref 22–32)
Calcium: 7.4 mg/dL — ABNORMAL LOW (ref 8.9–10.3)
Chloride: 98 mmol/L (ref 98–111)
Creatinine, Ser: 1.68 mg/dL — ABNORMAL HIGH (ref 0.61–1.24)
GFR calc Af Amer: 53 mL/min — ABNORMAL LOW (ref >60)
GFR calc non Af Amer: 46 mL/min — ABNORMAL LOW (ref >60)
Glucose, Bld: 286 mg/dL — ABNORMAL HIGH (ref 70–99)
Potassium: 4.5 mmol/L (ref 3.5–5.1)
Sodium: 131 mmol/L — ABNORMAL LOW (ref 135–145)

## 2018-08-06 LAB — UREA NITROGEN, URINE: Urea Nitrogen, Ur: 622 mg/dL

## 2018-08-06 LAB — HEMOGLOBIN A1C
Hgb A1c MFr Bld: 5.5 % (ref 4.8–5.6)
Mean Plasma Glucose: 111.15 mg/dL

## 2018-08-06 LAB — PREPARE PLATELET PHERESIS: Unit division: 0

## 2018-08-06 LAB — CBC
HCT: 32.4 % — ABNORMAL LOW (ref 39.0–52.0)
Hemoglobin: 10.7 g/dL — ABNORMAL LOW (ref 13.0–17.0)
MCH: 29 pg (ref 26.0–34.0)
MCHC: 33 g/dL (ref 30.0–36.0)
MCV: 87.8 fL (ref 80.0–100.0)
Platelets: 95 10*3/uL — ABNORMAL LOW (ref 150–400)
RBC: 3.69 MIL/uL — ABNORMAL LOW (ref 4.22–5.81)
RDW: 16.5 % — ABNORMAL HIGH (ref 11.5–15.5)
WBC: 11.4 10*3/uL — ABNORMAL HIGH (ref 4.0–10.5)
nRBC: 0 % (ref 0.0–0.2)

## 2018-08-06 LAB — TRIGLYCERIDES: Triglycerides: 405 mg/dL — ABNORMAL HIGH (ref <150)

## 2018-08-06 MED ORDER — METOPROLOL TARTRATE 5 MG/5ML IV SOLN
5.0000 mg | INTRAVENOUS | Status: DC | PRN
  Administered 2018-08-06 – 2018-08-10 (×12): 5 mg via INTRAVENOUS
  Filled 2018-08-06 (×12): qty 5

## 2018-08-06 MED ORDER — HYDRALAZINE HCL 20 MG/ML IJ SOLN
10.0000 mg | INTRAMUSCULAR | Status: DC | PRN
  Administered 2018-08-06 – 2018-08-10 (×13): 10 mg via INTRAVENOUS
  Filled 2018-08-06 (×10): qty 1

## 2018-08-06 MED ORDER — ACETAMINOPHEN 325 MG RE SUPP: 325.0000 mg | RECTAL | Status: DC | PRN

## 2018-08-06 MED ORDER — CHLORHEXIDINE GLUCONATE 0.12 % MT SOLN
15.0000 mL | Freq: Two times a day (BID) | OROMUCOSAL | Status: DC
  Administered 2018-08-07 – 2018-08-10 (×8): 15 mL via OROMUCOSAL
  Filled 2018-08-06 (×5): qty 15

## 2018-08-06 MED ORDER — INSULIN ASPART 100 UNIT/ML ~~LOC~~ SOLN: 0.0000 [IU] | Freq: Three times a day (TID) | SUBCUTANEOUS | Status: DC

## 2018-08-06 MED ORDER — IPRATROPIUM-ALBUTEROL 0.5-2.5 (3) MG/3ML IN SOLN
3.0000 mL | Freq: Four times a day (QID) | RESPIRATORY_TRACT | Status: DC
  Administered 2018-08-06 (×2): 3 mL via RESPIRATORY_TRACT
  Filled 2018-08-06 (×3): qty 3

## 2018-08-06 MED ORDER — IPRATROPIUM-ALBUTEROL 0.5-2.5 (3) MG/3ML IN SOLN
3.0000 mL | Freq: Four times a day (QID) | RESPIRATORY_TRACT | Status: DC
  Administered 2018-08-07: 09:00:00 3 mL via RESPIRATORY_TRACT
  Filled 2018-08-06: qty 3

## 2018-08-06 MED ORDER — ACETAMINOPHEN 160 MG/5ML PO SOLN: 325.0000 mg | ORAL | Status: DC | PRN

## 2018-08-06 MED ORDER — ORAL CARE MOUTH RINSE
15.0000 mL | Freq: Two times a day (BID) | OROMUCOSAL | Status: DC
  Administered 2018-08-06 – 2018-08-10 (×8): 15 mL via OROMUCOSAL

## 2018-08-06 NOTE — Progress Notes (Signed)
Paged Fields MD to see about progression orders after extubating patient. Ordered to keep central line, foley, and NGT but to discontinue arterial line.

## 2018-08-06 NOTE — Evaluation (Addendum)
Occupational Therapy Evaluation Patient Details Name: Brandon Snyder MRN: [id number] DOB: 1965-04-12 Today's Date: 08/06/2018    History of Present Illness Pt is a 53 y/o male with a several day history of abdominal pain. CT scan reveals a ruptured abdominal aortic aneurysm. S/p abdomnial aortic rupture repair 7/13. PMH: HFrEF, HTN, asthma.    Clinical Impression   PTA patient independent and working. Admitted for above and limited by problem list below, including pain, decreased activity tolerance, generalized weakness, and impaired balance.  Patient able to complete grooming with setup assist, UB ADLs with min assist, LB ADLs with max to total assist.  Attempted sit<>stand today with +2 max assist but pt unable to transition to full stand from EOB, able to scoot towards [facility] with min to mod assist.  Pt reports numb feet at EOB sitting. Patient highly motivated, anxious with mobility.  Vitals: BP after activity 172/92 (RN aware), SpO2 100 on 2L [place], RR 20-34, HR 100-108. Noted RN cleared mobility with MD prior to session. He will benefit from continued OT services while admitted and after dc at intensive CIR level rehab in order to return to PLOF, independent level with ADLs/mobility.  Will follow acutely.     Follow Up Recommendations  CIR    Equipment Recommendations  3 in 1 bedside commode    Recommendations for Other Services Rehab consult;PT consult     Precautions / Restrictions Precautions Precautions: Fall;Other (comment) Precaution Comments: NGT, abdominal incision Restrictions Weight Bearing Restrictions: No      Mobility Bed Mobility Overal bed mobility: Needs Assistance Bed Mobility: Supine to Sit;Rolling;Sit to Sidelying Rolling: Mod assist(to L )   Supine to sit: Mod assist;+2 for physical assistance;+2 for safety/equipment;HOB elevated   Sit to sidelying: Max assist;+2 for physical assistance;+2 for safety/equipment General bed mobility comments: patient upright in  bed upon entry, transitioned to EOB with guiding support of LEs and mod assist +2 for trunk support; returned to supine via sidelying and requires max assist for LB and trunk support +2   Transfers Overall transfer level: Needs assistance Equipment used: 2 person hand held assist Transfers: Sit to/from Stand           General transfer comment: attempted sit to stand from EOB with RN assist, max assist +2 with inability to complete full ascend into standing; pt able to lateral scoot towards HOB x 3 with min-mod assist     Balance Overall balance assessment: Needs assistance Sitting-balance support: No upper extremity supported;Feet supported Sitting balance-Leahy Scale: Fair Sitting balance - Comments: statically min guard to close supervision at EOB                                    ADL either performed or assessed with clinical judgement   ADL Overall ADL's : Needs assistance/impaired     Grooming: Set up;Sitting   Upper Body Bathing: Minimal assistance;Sitting   Lower Body Bathing: Moderate assistance;Sitting/lateral leans;+2 for safety/equipment   Upper Body Dressing : Minimal assistance;Sitting   Lower Body Dressing: +2 for physical assistance;Maximal assistance;Sitting/lateral leans     Toilet Transfer Details (indicate cue type and reason): deferred          Functional mobility during ADLs: Moderate assistance;Maximal assistance;+2 for physical assistance;+2 for safety/equipment General ADL Comments: pt limited by weakness, pain, decreasesd act tolerance     Vision Baseline Vision/History: Wears glasses Wears Glasses: At all times Patient Visual  Report: No change from baseline       Perception     Praxis      Pertinent Vitals/Pain Pain Assessment: Faces Faces Pain Scale: Hurts whole lot Pain Location: abdominal incision Pain Descriptors / Indicators: Discomfort;Grimacing;Operative site guarding Pain Intervention(s): Monitored during  session;Premedicated before session;Repositioned;Limited activity within patient's tolerance     Hand Dominance Right   Extremity/Trunk Assessment Upper Extremity Assessment Upper Extremity Assessment: Overall WFL for tasks assessed   Lower Extremity Assessment Lower Extremity Assessment: Defer to PT evaluation       Communication Communication Communication: No difficulties   Cognition Arousal/Alertness: Awake/alert Behavior During Therapy: WFL for tasks assessed/performed(anxious with mobility) Overall Cognitive Status: Within Functional Limits for tasks assessed                                 General Comments: appears WFL, labile with mobility and frustrated as unable to stand today    General Comments  RN cleared with MD for mobility OOB; Vitals: BP 172/92 after activity, spO2 100% on 2L [place], RR 20-34, HR 100-108; noted anxious with mobility and requires cueing for PLB     Exercises     Shoulder Instructions      Home Living Family/patient expects to be discharged to:: Private residence Living Arrangements: Spouse/significant other(girlfriend) Available Help at Discharge: Family;Available PRN/intermittently Type of Home: House Home Access: Level entry     Home Layout: Two level;Able to live on main level with bedroom/bathroom;Bed/bath upstairs     Bathroom Shower/Tub: Chief Strategy OfficerTub/shower unit   Bathroom Toilet: Standard     Home Equipment: None          Prior Functioning/Environment Level of Independence: Independent        Comments: supervisor at [employer]         OT Problem List: Decreased strength;Decreased activity tolerance;Impaired balance (sitting and/or standing);Decreased knowledge of use of DME or AE;Decreased knowledge of precautions;Pain;Cardiopulmonary status limiting activity      OT Treatment/Interventions: Self-care/ADL training;Energy conservation;DME and/or AE instruction;Therapeutic exercise;Therapeutic  activities;Patient/family education;Balance training    OT Goals(Current goals can be found in the care plan section) Acute Rehab OT Goals Patient Stated Goal: to be able to get up  OT Goal Formulation: With patient Time For Goal Achievement: 08/20/18 Potential to Achieve Goals: Good  OT Frequency: Min 3X/week   Barriers to D/C:            Co-evaluation              AM-PAC OT "6 Clicks" Daily Activity     Outcome Measure Help from another person eating meals?: Total Help from another person taking care of personal grooming?: A Little Help from another person toileting, which includes using toliet, bedpan, or urinal?: A Lot Help from another person bathing (including washing, rinsing, drying)?: A Lot Help from another person to put on and taking off regular upper body clothing?: A Little Help from another person to put on and taking off regular lower body clothing?: Total 6 Click Score: 12   End of Session Equipment Utilized During Treatment: Gait belt;Oxygen Nurse Communication: Mobility status  Activity Tolerance: Patient tolerated treatment well Patient left: in bed;with call bell/phone within reach;with nursing/sitter in room;with SCD's reapplied  OT Visit Diagnosis: Other abnormalities of gait and mobility (R26.89);Muscle weakness (generalized) (M62.81);Pain Pain - part of body: (abdominal incision)  Time: 1610-96041441-1507 OT Time Calculation (min): 26 min Charges:  OT General Charges $OT Visit: 1 Visit OT Evaluation $OT Eval Moderate Complexity: 1 Mod OT Treatments $Self Care/Home Management : 8-22 mins  Chancy Milroyhristie S Henriette Hesser, OT Acute Rehabilitation Services Pager [phone] Office [phone]   Chancy MilroyChristie S Duante Arocho 08/06/2018, 4:42 PM

## 2018-08-06 NOTE — Procedures (Signed)
Extubation Procedure Note  Patient Details:   Name: Brandon Snyder DOB: [birth year] MRN: [id number]   Airway Documentation:    Vent end date: 08/06/18 Vent end time: 1019   Evaluation  O2 sats: stable throughout Complications: No apparent complications Patient did tolerate procedure well. Bilateral Breath Sounds: Diminished   Yes   Pt extubated to 2L N/C.  No stridor noted.  RN @ bedside.  Donnetta Hail 08/06/2018, 10:20 AM

## 2018-08-06 NOTE — Progress Notes (Addendum)
2005: Paged Dr. Trula Slade regarding pt BP 121/975(883), HR 109. Pt has pain relief. Also no AM labs ordered. Orders to keep SBP <140 with new PRN orders of metoprolol and hydralazine. Will continue to monitor.   0430: PRN BP medications given q1h to control BP, with no improvement in last hour. Pt also developed vent trigemeny whilst giving IVP metoprolol. Paged MD, orders received for esmolol gtt.

## 2018-08-06 NOTE — Progress Notes (Signed)
Assisted tele visit to patient with partner. ° °Thomas, Miken Stecher Renee, RN  ° °

## 2018-08-06 NOTE — Progress Notes (Signed)
[place] MD notified that foley catheter leaking and removed and that after it was removed, patient immediately voided 225 mL of red urine. Also clarified that it was okay for patient to get out of bed with therapy.

## 2018-08-06 NOTE — Progress Notes (Signed)
PT Cancellation Note  Patient Details Name: TRUNG WENZL MRN: [id number] DOB: [birth year]   Cancelled Treatment:    Reason Eval/Treat Not Completed: Medical issues which prohibited therapy(Awaiting extubation.  Will check back in pm as able. )   Denice Paradise 08/06/2018, 8:59 AM  Messina Kosinski,PT Acute Rehabilitation Services Pager:  [phone]  Office:  [phone]

## 2018-08-06 NOTE — Progress Notes (Signed)
eLink Physician-Brief Progress Note Patient Name: DEONTRAE DRINKARD DOB: [birth year] MRN: [id number]   Date of Service  08/06/2018  HPI/Events of Note  CBG 286 this moring  eICU Interventions  Ordered moderate dose SSI q 4     Intervention Category Major Interventions: Hyperglycemia - active titration of insulin therapy  Judd Lien 08/06/2018, 5:46 AM

## 2018-08-06 NOTE — Progress Notes (Addendum)
NAME:  Brandon ConnersDamon L Messler, MRN:  [id number], DOB:  [birth year], LOS: 2 ADMISSION DATE:  07/26/2018, CONSULTATION DATE:  08/05/18 REFERRING MD:  Dr. Darrick PennaFields , CHIEF COMPLAINT:  Ruptured abdominal Aortic Aneurysm  Brief History   This is a 53 yo with history of asthma, and CHF for who we are consulted sp repair of ruptured abdominal aortic aneurysm. 11 units of total blood products in the peri-op period.   Past Medical History  HFrEF HTN Asthma  Significant Hospital Events   Abdominal Aortic rupture repair on 08/05/18  Consults:  NA  Procedures:  Abdominal Aortic rupture repair on 08/05/18  Significant Diagnostic Tests:  CT abdomen as noted above  Micro Data:  NA SARS COV2 negative  Antimicrobials:  NA  Interim history/subjective:  No acute issues overnight.  C/o foley related pain.   Objective   Blood pressure (!) 151/86, pulse 95, temperature 99.7 F (37.6 C), temperature source Oral, resp. rate 18, height 6\' 2"  (1.88 m), weight (!) 155.4 kg, SpO2 100 %. CVP:  [13 mmHg-23 mmHg] 15 mmHg  Vent Mode: PRVC FiO2 (%):  [40 %] 40 % Set Rate:  [18 bmp] 18 bmp Vt Set:  [500 mL-650 mL] 650 mL PEEP:  [5 cmH20] 5 cmH20 Plateau Pressure:  [22 cmH20-30 cmH20] 26 cmH20   Intake/Output Summary (Last 24 hours) at 08/06/2018 0811 Last data filed at 08/06/2018 0700 Gross per 24 hour  Intake 4116.63 ml  Output 2160 ml  Net 1956.63 ml   Filed Weights   07/30/2018 2105 08/05/18 0226 08/06/18 0415  Weight: 100 kg (!) 145.2 kg (!) 155.4 kg   Examination: General: Middle aged male in NAD HENT: [place]/AT, PERRL, no JVD Lungs: Clear Cardiovascular: RRR no MRG Abdomen: midline longitudinal surgical dressing CDI Extremities:No acute deformity or ROM limitation.  Neuro: RASS 0. Alert oriented.  GU: Foley in place draining blood tinged urine.   Resolved Hospital Problem list   NA  Assessment & Plan:   Acute hypercarbic respiratory failure in the perioperative period  Asthma without acute  exacerbation.  - I have turned propofol off with hopes of weaning to extubation today. He just received a moderate dose of morphine, and was bradypneic on PS trial. Will try again in a bit.   - Scheduled nebs - RASS goal 0 for vent  Ruptured AAA: s/p repair by Dr. Darrick PennaFields 7/13 - BP goal as below - Management per VVS.  Hypertension  Hypotension: likely sedation related, resolved - Volume up for admission - Keep SBP < 160mmHg   Hyperglycemia with no history of DM.  - CBG monitoring and SSI  AKI:  -Follow urine lytes and output -Fluids per primary team  Best practice:  Diet: NPO for now Pain/Anxiety/Delirium protocol (if indicated): Propofol, morphine PRN VAP protocol (if indicated): per protocol DVT prophylaxis: SCD GI prophylaxis: Do not anticipate patient will need as will likely be extubated soon Glucose control: SSI Mobility: PT when extubated, BR for now Code Status: FULL Family Communication:  Disposition: CVICU for now  Labs   CBC: Recent Labs  Lab 08/08/2018 1628  08/05/18 0312 08/05/18 0321 08/05/18 1021 08/05/18 2116 08/06/18 0410  WBC 10.7*  --  13.4*  --   --  11.8* 11.4*  NEUTROABS  --   --   --   --   --  9.8*  --   HGB 9.5*   < > 12.1* 12.6* 11.6* 10.9* 10.7*  HCT 32.1*   < > 37.0* 37.0* 34.0* 32.8* 32.4*  MCV 87.9  --  87.3  --   --  84.1 87.8  PLT 157  --  81*  --   --  97* 95*   < > = values in this interval not displayed.    Basic Metabolic Panel: Recent Labs  Lab 08/06/2018 1628  08/05/18 0312 08/05/18 0321 08/05/18 1021 08/05/18 1139 08/06/18 0410  NA 136   < > 134* 136 136 136 131*  K 4.3   < > 5.0 5.1 4.3 4.4 4.5  CL 97*  --  101  --   --  101 98  CO2 27  --  24  --   --  25 26  GLUCOSE 120*  --  122*  --   --  119* 286*  BUN 36*  --  31*  --   --  27* 18  CREATININE 4.81*  --  3.21*  --   --  2.85* 1.68*  CALCIUM 8.5*  --  8.0*  --   --  7.8* 7.4*  MG  --   --  1.8  --   --   --   --   PHOS  --   --   --   --   --  3.5  --    < >  = values in this interval not displayed.   GFR: Estimated Creatinine Clearance: 81.1 mL/min (A) (by C-G formula based on SCr of 1.68 mg/dL (H)). Recent Labs  Lab 08/23/2018 1628 08/05/18 0312 08/05/18 2116 08/06/18 0410  WBC 10.7* 13.4* 11.8* 11.4*    Liver Function Tests: Recent Labs  Lab 08/16/2018 1628 08/05/18 0312  AST 17 39  ALT 20 35  ALKPHOS 57 38  BILITOT 1.0 5.1*  PROT 6.1* 4.8*  ALBUMIN 3.5 3.0*   Recent Labs  Lab 07/28/2018 1628 08/05/18 0312  LIPASE 21  --   AMYLASE  --  101*   No results for input(s): AMMONIA in the last 168 hours.  ABG    Component Value Date/Time   PHART 7.365 08/05/2018 1021   PCO2ART 47.5 08/05/2018 1021   PO2ART 90.0 08/05/2018 1021   HCO3 27.2 08/05/2018 1021   TCO2 29 08/05/2018 1021   ACIDBASEDEF 3.0 (H) 08/05/2018 0119   O2SAT 97.0 08/05/2018 1021     Coagulation Profile: Recent Labs  Lab 08/05/18 0312  INR 1.4*    Cardiac Enzymes: No results for input(s): CKTOTAL, CKMB, CKMBINDEX, TROPONINI in the last 168 hours.  HbA1C: Hgb A1c MFr Bld  Date/Time Value Ref Range Status  08/06/2018 04:10 AM 5.5 4.8 - 5.6 % Final    Comment:    (NOTE) Pre diabetes:          5.7%-6.4% Diabetes:              >6.4% Glycemic control for   <7.0% adults with diabetes     CBG: Recent Labs  Lab 08/06/18 0758 08/06/18 0800  GLUCAP 69* 108*    Review of Systems:   As noted in HPI  Past Medical History  He,  has a past medical history of Asthma, CHF (congestive heart failure) ([place]), Hypertension, and Pneumonia.   Surgical History    Past Surgical History:  Procedure Laterality Date  . THORACOABDOMINAL AORTIC ANEURYSM REPAIR N/A 08/16/2018   Procedure: Ruptured ABDOMINAL ANEURYSM  REPAIR WITH HEMASHIELD GOLD VASCULAR GRAFT 18MM;  Surgeon: Elam Dutch, MD;  Location: [facility] OR;  Service: Vascular;  Laterality: N/A;  . ULTRASOUND GUIDANCE FOR VASCULAR ACCESS  Right 06-15-18   Procedure: Ultrasound Guidance For Vascular  Access WITH AORTIC BALLOON INSERTION;  Surgeon: Sherren KernsFields, Charles E, MD;  Location[facility] OR;  Service: Vascular;  Laterality: Right;     Social History   reports that he has never smoked. He has never used smokeless tobacco. He reports current alcohol use. He reports that he does not use drugs.   Family History   His family history includes Cancer in his mother.   Allergies No Known Allergies   Home Medications  Prior to Admission medications   Medication Sig Start Date End Date Taking? Authorizing Provider  dicyclomine (BENTYL) 10 MG capsule Take 10 mg by mouth 3 (three) times daily as needed for muscle spasms. 08/03/18  Yes [provider]  naproxen (NAPROSYN) 500 MG tablet Take 500 mg by mouth 2 (two) times daily as needed for pain. 07/25/18  Yes [provider]  ondansetron (ZOFRAN-ODT) 4 MG disintegrating tablet Take 4 mg by mouth 2 (two) times daily as needed for nausea. 08/03/18  Yes [provider]  traMADol (ULTRAM) 50 MG tablet Take 50 mg by mouth 2 (two) times daily as needed for pain. 07/09/18  Yes [provider]  carvedilol (COREG) 12.5 MG tablet Take 2 tablets (25 mg total) by mouth 2 (two) times daily. PATIENT NEEDS OFFICE VISIT FOR ADDITIONAL REFILLS Patient not taking: Reported on 06-15-18 12/23/13   Hamilton CapriLe, Thao P, DO  furosemide (LASIX) 40 MG tablet Take 1 tablet (40 mg total) by mouth 2 (two) times daily. 1 tab twice daily. Patient not taking: Reported on 06-15-18 02/21/13   Le, Thao P, DO  isosorbide-hydrALAZINE (BIDIL) 20-37.5 MG per tablet Take 1 tablet by mouth 3 (three) times daily. Patient not taking: Reported on 06-15-18 02/21/13   Le, Thao P, DO  potassium chloride (KLOR-CON) 20 MEQ packet Take 40 mEq by mouth daily. Patient not taking: Reported on 06-15-18 02/21/13   Lenell AntuLe, Thao P, DO     Critical care time: 60 min     Joneen RoachPaul Hoffman, AGACNP-BC [facility] Pager [phone] or [phone]  08/06/2018 8:11 AM   Attending Note:  53 year old male with AAA repair who was left intubated post op.  No events overnight, no new complaints.  On exam, lungs are clear bilaterally.  I reviewed CXR myself, ETT is in a good position.  Discussed with PCCM-NP and RT.  Will proceed with extubation today.  SBT, OOB and PT evaluation.  Hold off abx.  Will re-evaluate in the afternoon.  Replace electrolytes.  IVF as ordered.  PCCM will continue to follow.  The patient is critically ill with multiple organ systems failure and requires high complexity decision making for assessment and support, frequent evaluation and titration of therapies, application of advanced monitoring technologies and extensive interpretation of multiple databases.   Critical Care Time devoted to patient care services described in this note is  32  Minutes. This time reflects time of care of this signee Dr Koren BoundWesam Yacoub. This critical care time does not reflect procedure time, or teaching time or supervisory time of PA/NP/Med student/Med Resident etc but could involve care discussion time.  Alyson ReedyWesam G. Yacoub, M.D. [facility]. Pager: [phone]. After hours pager: [phone].

## 2018-08-06 NOTE — Progress Notes (Signed)
Inpatient Diabetes Program Recommendations  AACE/ADA: New Consensus Statement on Inpatient Glycemic Control (2015)  Target Ranges:  Prepandial:   less than 140 mg/dL      Peak postprandial:   less than 180 mg/dL (1-2 hours)      Critically ill patients:  140 - 180 mg/dL   Lab Results  Component Value Date   GLUCAP 111 (H) 08/06/2018   HGBA1C 5.5 08/06/2018    Review of Glycemic Control Results for SAIFULLAH, JOLLEY (MRN [id number]) as of 08/06/2018 12:16  Ref. Range 08/06/2018 08:00 08/06/2018 11:40  Glucose-Capillary Latest Ref Range: 70 - 99 mg/dL 108 (H) 111 (H)   Diabetes history: No hx noted Outpatient Diabetes medications: none Current orders for Inpatient glycemic control: Novolog 0-15 units TID  Inpatient Diabetes Program Recommendations:    Noted extubation.   If to remain NPO consider switching correction to Novolog 0-9 units Q4H.   Thanks, Bronson Curb, MSN, RNC-OB Diabetes Coordinator [phone] (8a-5p)

## 2018-08-06 NOTE — Progress Notes (Signed)
Assisted tele visit to patient with father.  Brandon Snyder, [name], RN   

## 2018-08-06 NOTE — Progress Notes (Signed)
[facility] [place]  Subjective  - awake and following commands   Objective (!) 151/86 95 99.7 F (37.6 C) (Oral) 18 100%  Intake/Output Summary (Last 24 hours) at 08/06/2018 0743 Last data filed at 08/06/2018 0700 Gross per 24 hour  Intake 4351.43 ml  Output 2235 ml  Net 2116.43 ml   Abdomen soft incision clean 2+ pedal pulses PEEP 5 O2 40%  Assessment/Planning: 1. ATN having some renal recovery at this point would keep fluids even to slightly positive today will treat any oliguria or tachycardia low BP with fluid  2. Ruptured AAA hemoglobin stable  3.  VDRF per CCM but looks like he could tolerate extubation  4. Hyperglycemia SSI for now  Ruta Hinds 08/06/2018 7:43 AM --  Laboratory Lab Results: Recent Labs    08/05/18 2116 08/06/18 0410  WBC 11.8* 11.4*  HGB 10.9* 10.7*  HCT 32.8* 32.4*  PLT 97* 95*   BMET Recent Labs    08/05/18 1139 08/06/18 0410  NA 136 131*  K 4.4 4.5  CL 101 98  CO2 25 26  GLUCOSE 119* 286*  BUN 27* 18  CREATININE 2.85* 1.68*  CALCIUM 7.8* 7.4*    COAG Lab Results  Component Value Date   INR 1.4 (H) 08/05/2018   No results found for: PTT

## 2018-08-06 NOTE — Progress Notes (Signed)
PCCM INTERVAL PROGRESS NOTE   Patient tolerating extubation well. No complaints currently.    PCCM will sign off. Please call back with any additional need.    Georgann Housekeeper, AGACNP-BC [place] Pager [phone] or [phone]  08/06/2018 6:18 PM

## 2018-08-06 NOTE — Anesthesia Postprocedure Evaluation (Signed)
Anesthesia Post Note  Patient: Coltrane L Winnett  Procedure(s) Performed: Ruptured ABDOMINAL ANEURYSM  REPAIR WITH HEMASHIELD GOLD VASCULAR GRAFT 18MM (N/A Abdomen) Ultrasound Guidance For Vascular Access WITH AORTIC BALLOON INSERTION (Right Groin)     Patient location during evaluation: SICU Anesthesia Type: General Level of consciousness: sedated Pain management: pain level controlled Vital Signs Assessment: post-procedure vital signs reviewed and stable Respiratory status: patient remains intubated per anesthesia plan Cardiovascular status: stable Postop Assessment: no apparent nausea or vomiting Anesthetic complications: no    Last Vitals:  Vitals:   08/06/18 0900 08/06/18 1000  BP: (!) 153/78 (!) 155/91  Pulse: 96   Resp: (!) 22 (!) 21  Temp:    SpO2: 97% 98%                  Effie Berkshire

## 2018-08-07 ENCOUNTER — Encounter (HOSPITAL_COMMUNITY): Payer: Self-pay | Admitting: General Practice

## 2018-08-07 LAB — BASIC METABOLIC PANEL
Anion gap: 8 (ref 5–15)
BUN: 13 mg/dL (ref 6–20)
CO2: 22 mmol/L (ref 22–32)
Calcium: 7.2 mg/dL — ABNORMAL LOW (ref 8.9–10.3)
Chloride: 99 mmol/L (ref 98–111)
Creatinine, Ser: 1.14 mg/dL (ref 0.61–1.24)
GFR calc Af Amer: >60 mL/min (ref >60)
GFR calc non Af Amer: >60 mL/min (ref >60)
Glucose, Bld: 544 mg/dL (ref 70–99)
Potassium: 4.4 mmol/L (ref 3.5–5.1)
Sodium: 129 mmol/L — ABNORMAL LOW (ref 135–145)

## 2018-08-07 LAB — CBC
HCT: 30.6 % — ABNORMAL LOW (ref 39.0–52.0)
Hemoglobin: 9.7 g/dL — ABNORMAL LOW (ref 13.0–17.0)
MCH: 28.2 pg (ref 26.0–34.0)
MCHC: 31.7 g/dL (ref 30.0–36.0)
MCV: 89 fL (ref 80.0–100.0)
Platelets: 107 10*3/uL — ABNORMAL LOW (ref 150–400)
RBC: 3.44 MIL/uL — ABNORMAL LOW (ref 4.22–5.81)
RDW: 16.5 % — ABNORMAL HIGH (ref 11.5–15.5)
WBC: 12.2 10*3/uL — ABNORMAL HIGH (ref 4.0–10.5)
nRBC: 0 % (ref 0.0–0.2)

## 2018-08-07 LAB — GLUCOSE, CAPILLARY
Glucose-Capillary: 105 mg/dL — ABNORMAL HIGH (ref 70–99)
Glucose-Capillary: 108 mg/dL — ABNORMAL HIGH (ref 70–99)
Glucose-Capillary: 111 mg/dL — ABNORMAL HIGH (ref 70–99)
Glucose-Capillary: 69 mg/dL — ABNORMAL LOW (ref 70–99)
Glucose-Capillary: 76 mg/dL (ref 70–99)
Glucose-Capillary: 78 mg/dL (ref 70–99)
Glucose-Capillary: 94 mg/dL (ref 70–99)
Glucose-Capillary: 96 mg/dL (ref 70–99)

## 2018-08-07 LAB — TRIGLYCERIDES: Triglycerides: 87 mg/dL (ref <150)

## 2018-08-07 MED ORDER — NALOXONE HCL 0.4 MG/ML IJ SOLN: 0.4000 mg | INTRAMUSCULAR | Status: DC | PRN

## 2018-08-07 MED ORDER — IPRATROPIUM-ALBUTEROL 0.5-2.5 (3) MG/3ML IN SOLN: 3.0000 mL | RESPIRATORY_TRACT | Status: DC | PRN

## 2018-08-07 MED ORDER — DIPHENHYDRAMINE HCL 50 MG/ML IJ SOLN: 12.5000 mg | Freq: Four times a day (QID) | INTRAMUSCULAR | Status: DC | PRN

## 2018-08-07 MED ORDER — ESMOLOL HCL-SODIUM CHLORIDE 2000 MG/100ML IV SOLN
25.0000 ug/kg/min | INTRAVENOUS | Status: DC
  Administered 2018-08-07: 50 ug/kg/min via INTRAVENOUS
  Administered 2018-08-07: 05:00:00 25 ug/kg/min via INTRAVENOUS
  Administered 2018-08-08: 150 ug/kg/min via INTRAVENOUS
  Administered 2018-08-08: 75 ug/kg/min via INTRAVENOUS
  Administered 2018-08-08: 50 ug/kg/min via INTRAVENOUS
  Administered 2018-08-08: 65 ug/kg/min via INTRAVENOUS
  Administered 2018-08-08: 90 ug/kg/min via INTRAVENOUS
  Administered 2018-08-08: 75 ug/kg/min via INTRAVENOUS
  Administered 2018-08-08: 50 ug/kg/min via INTRAVENOUS
  Administered 2018-08-08 (×2): 150 ug/kg/min via INTRAVENOUS
  Administered 2018-08-09: 100 ug/kg/min via INTRAVENOUS
  Administered 2018-08-09: 50 ug/kg/min via INTRAVENOUS
  Administered 2018-08-09 (×2): 140 ug/kg/min via INTRAVENOUS
  Administered 2018-08-09: 15 ug/kg/min via INTRAVENOUS
  Administered 2018-08-09: 100 ug/kg/min via INTRAVENOUS
  Filled 2018-08-07 (×19): qty 100

## 2018-08-07 MED ORDER — DEXTROSE-NACL 5-0.9 % IV SOLN
INTRAVENOUS | Status: DC
  Administered 2018-08-07 – 2018-08-10 (×7): via INTRAVENOUS

## 2018-08-07 MED ORDER — SODIUM CHLORIDE 0.9 % IV SOLN
INTRAVENOUS | Status: DC
  Administered 2018-08-07: 09:00:00 via INTRAVENOUS

## 2018-08-07 MED ORDER — SODIUM CHLORIDE 0.9 % IV SOLN
INTRAVENOUS | Status: DC | PRN
  Administered 2018-08-07: 1000 mL via INTRAVENOUS

## 2018-08-07 MED ORDER — DEXTROSE 50 % IV SOLN
INTRAVENOUS | Status: AC
  Administered 2018-08-07: 16:00:00
  Filled 2018-08-07: qty 50

## 2018-08-07 MED ORDER — MORPHINE SULFATE 2 MG/ML IV SOLN
INTRAVENOUS | Status: DC
  Administered 2018-08-07: 10:00:00 via INTRAVENOUS
  Administered 2018-08-07: 6 mg via INTRAVENOUS
  Administered 2018-08-07: 3 mg via INTRAVENOUS
  Administered 2018-08-08 – 2018-08-09 (×3): 7.5 mg via INTRAVENOUS
  Administered 2018-08-09: 6 mg via INTRAVENOUS
  Administered 2018-08-09 – 2018-08-11 (×3): via INTRAVENOUS
  Filled 2018-08-07 (×4): qty 30

## 2018-08-07 MED ORDER — DIPHENHYDRAMINE HCL 12.5 MG/5ML PO ELIX
12.5000 mg | ORAL_SOLUTION | Freq: Four times a day (QID) | ORAL | Status: DC | PRN
  Filled 2018-08-07: qty 5

## 2018-08-07 MED ORDER — ONDANSETRON HCL 4 MG/2ML IJ SOLN: 4.0000 mg | Freq: Four times a day (QID) | INTRAMUSCULAR | Status: DC | PRN

## 2018-08-07 MED ORDER — SODIUM CHLORIDE 0.9% FLUSH: 9.0000 mL | INTRAVENOUS | Status: DC | PRN

## 2018-08-07 MED ORDER — INSULIN ASPART 100 UNIT/ML ~~LOC~~ SOLN: 0.0000 [IU] | SUBCUTANEOUS | Status: DC

## 2018-08-07 MED FILL — Heparin Sodium (Porcine) Inj 1000 Unit/ML: INTRAMUSCULAR | Qty: 30 | Status: AC

## 2018-08-07 MED FILL — Sodium Chloride Irrigation Soln 0.9%: Qty: 3000 | Status: AC

## 2018-08-07 NOTE — Progress Notes (Signed)
Hypoglycemic Event  CBG: 69  Treatment: 1/2 amp D50  Symptoms: none  Follow-up CBG: Time: 1630 CBG Result: 76  Possible Reasons for Event: patient NPO and stopped D5 gtt this morning  Comments/MD notified: Donzetta Matters MD - see new orders for D5NS infusion and changes to SSI coverage times   Waneta Martins

## 2018-08-07 NOTE — Progress Notes (Signed)
Nutrition Follow-up  RD working remotely.  DOCUMENTATION CODES:   Not applicable  INTERVENTION:   - Once diet advanced, RD to order Ensure Enlive po BID, each supplement provides 350 kcal and 20 grams of protein  - Once diet advanced, RD to order Pro-stat 30 ml po BID, each supplement provides 100 kcals and 15 grams protein  NUTRITION DIAGNOSIS:   Inadequate oral intake related to inability to eat as evidenced by NPO status.  Ongoing  GOAL:   Patient will meet greater than or equal to 90% of their needs  Unmet at this time  MONITOR:   Diet advancement, Labs, I & O's, Weight trends, Skin  REASON FOR ASSESSMENT:   Ventilator    ASSESSMENT:   53 year old male who presented to the ED on 7/12 with abdominal pain. PMH of CHF, asthma, HTN. Abdominal CT showing ruptured infrarenal AAA with retroperitoneal blood.  7/13 - s/p repair of AAA, intubated 7/14 - extubated  Per Vascular Surgery, pt with an ileus. Plan is to keep pt NPO with NG tube to suction until pt is passing flatus. Also plan to start diuresing pt tomorrow if renal function is stable.  Per RN assessment, pt with hypoactive bowel sounds. Abdomen is soft and obese.  Weight up 128 lbs since 7/12. Pt is net positive 11.6 L. Question accuracy of weight on 7/12. Will continue to monitor trends. Per RN edema assessment, pt with mild pitting edema to BLE.  Medications reviewed and include: Colace, SSI, Protonix, IV esmolol IVF: NS @ 50 mo/hr  Labs reviewed: sodium 129, hemoglobin 9.7 CBG's: 93-111 x 24 hours  UOP: 1615 ml x 24 hours NGT: 1100 ml x 24 hours I/O's: +11.6 L since admit  Diet Order:   Diet Order            Diet NPO time specified  Diet effective now              EDUCATION NEEDS:   No education needs have been identified at this time  Skin:  Skin Assessment: Skin Integrity Issues: Incisions: closed incisions to right groin and abdomen  Last BM:  no documented BM  Height:   Ht  Readings from Last 1 Encounters:  Aug 14, 2018 6\' 2"  (1.88 m)    Weight:   Wt Readings from Last 1 Encounters:  08/07/18 (!) 158.1 kg    Ideal Body Weight:  86.4 kg  BMI:  Body mass index is 44.75 kg/m.  Estimated Nutritional Needs:   Kcal:  2200-2400  Protein:  115-130 grams  Fluid:  >/= 2.0 L    Gaynell Face, MS, RD, LDN Inpatient Clinical Dietitian Pager: [phone] Weekend/After Hours: [phone]

## 2018-08-07 NOTE — Progress Notes (Signed)
Rehab Admissions Coordinator Note:  Per OT recommendation, this patient was screened by Jhonnie Garner for appropriateness for an Inpatient Acute Rehab Consult.  At this time, we are recommending Inpatient Rehab consult. AC will contact MD to request consult order.   Jhonnie Garner 08/07/2018, 9:52 AM  I can be reached at [phone].

## 2018-08-07 NOTE — Evaluation (Addendum)
Physical Therapy Evaluation Patient Details Name: Brandon Snyder MRN: [id number] DOB: 02-05-1965 Today's Date: 08/07/2018   History of Present Illness  Pt is a 53 y/o male with a several day history of abdominal pain. CT scan reveals a ruptured abdominal aortic aneurysm. S/p abdomnial aortic rupture repair 7/13. PMH: HFrEF, HTN, asthma.   Clinical Impression  Pt admitted with above diagnosis. Pt currently with functional limitations due to the deficits listed below (see PT Problem List). Pt was able to stand and pivot to chair with mod assist of 2 to chair with RW.  Should progress well once pain is better under control. Feel rehab stay will maximize pts independence.   Will follow acutely.  Pt will benefit from skilled PT to increase their independence and safety with mobility to allow discharge to the venue listed below.      Follow Up Recommendations Supervision/Assistance - 24 hour;CIR    Equipment Recommendations  (bariatric rolling walker, wide 3N1)    Recommendations for Other Services       Precautions / Restrictions Precautions Precautions: Fall;Other (comment) Precaution Comments: NGT, abdominal incision Restrictions Weight Bearing Restrictions: No      Mobility  Bed Mobility Overal bed mobility: Needs Assistance Bed Mobility: Rolling;Sit to Sidelying;Sidelying to Sit Rolling: Mod assist(to right) Sidelying to sit: Mod assist;HOB elevated       General bed mobility comments: patient upright in bed upon entry, transitioned to EOB with guiding support of LEs and mod assist +1 for trunk support.  Had pt hug pillow for comfort  Transfers Overall transfer level: Needs assistance Equipment used: Rolling walker (2 wheeled) Transfers: Sit to/from [facility] Sit to Stand: Mod assist;+2 safety/equipment;From elevated surface Stand pivot transfers: Min assist;+2 safety/equipment;From elevated surface       General transfer comment: Pt able to stand with  +2 mod assist for power up with bed raised.  Pt reports he cant feel his feet well and that they are numb but he was able to take pivotal steps to chair with min to min guard assist and cues for technique.   Ambulation/Gait                Stairs            Wheelchair Mobility    Modified Rankin (Stroke Patients Only)       Balance Overall balance assessment: Needs assistance Sitting-balance support: No upper extremity supported;Feet supported Sitting balance-Leahy Scale: Fair Sitting balance - Comments: statically min guard to close supervision at EOB    Standing balance support: Bilateral upper extremity supported;During functional activity Standing balance-Leahy Scale: Poor Standing balance comment: relies on UE support and external support for balance                             Pertinent Vitals/Pain Pain Assessment: Faces Faces Pain Scale: Hurts whole lot Pain Location: abdominal incision Pain Descriptors / Indicators: Discomfort;Grimacing;Operative site guarding Pain Intervention(s): Limited activity within patient's tolerance;Monitored during session;Premedicated before session;Repositioned;PCA encouraged    Home Living Family/patient expects to be discharged to:: Private residence Living Arrangements: Spouse/significant other(girlfriend) Available Help at Discharge: Family;Available PRN/intermittently Type of Home: House Home Access: Stairs to enter Entrance Stairs-Rails: Right;Left;Can reach both Entrance Stairs-Number of Steps: 3 Home Layout: Two level;Able to live on main level with bedroom/bathroom;Bed/bath upstairs Home Equipment: None      Prior Function Level of Independence: Independent         Comments:  supervisor at [employer]      Hand Dominance   Dominant Hand: Right    Extremity/Trunk Assessment   Upper Extremity Assessment Upper Extremity Assessment: Defer to OT evaluation    Lower Extremity  Assessment Lower Extremity Assessment: RLE deficits/detail;LLE deficits/detail RLE Sensation: decreased light touch LLE Sensation: decreased light touch    Cervical / Trunk Assessment Cervical / Trunk Assessment: Normal  Communication   Communication: No difficulties  Cognition Arousal/Alertness: Awake/alert Behavior During Therapy: WFL for tasks assessed/performed(anxious with mobility) Overall Cognitive Status: Within Functional Limits for tasks assessed                                        General Comments General comments (skin integrity, edema, etc.): Pt on 2LO2 with sats >90%.  Hr 87 -100 bpm, BP 137/87    Exercises     Assessment/Plan    PT Assessment Patient needs continued PT services  PT Problem List Decreased activity tolerance;Decreased mobility;Decreased knowledge of use of DME;Decreased safety awareness;Decreased knowledge of precautions;Pain;Decreased balance       PT Treatment Interventions DME instruction;Gait training;Functional mobility training;Therapeutic activities;Therapeutic exercise;Balance training;Patient/family education    PT Goals (Current goals can be found in the Care Plan section)  Acute Rehab PT Goals Patient Stated Goal: to home  PT Goal Formulation: With patient Time For Goal Achievement: 08/21/18 Potential to Achieve Goals: Good    Frequency Min 3X/week   Barriers to discharge        Co-evaluation               AM-PAC PT "6 Clicks" Mobility  Outcome Measure Help needed turning from your back to your side while in a flat bed without using bedrails?: A Lot Help needed moving from lying on your back to sitting on the side of a flat bed without using bedrails?: A Lot Help needed moving to and from a bed to a chair (including a wheelchair)?: A Lot Help needed standing up from a chair using your arms (e.g., wheelchair or bedside chair)?: A Lot Help needed to walk in hospital room?: A Lot Help needed climbing  3-5 steps with a railing? : Total 6 Click Score: 11    End of Session Equipment Utilized During Treatment: Gait belt;Oxygen Activity Tolerance: Patient limited by fatigue Patient left: in chair;with call bell/phone within reach;with chair alarm set Nurse Communication: Mobility status PT Visit Diagnosis: Unsteadiness on feet (R26.81);Muscle weakness (generalized) (M62.81);Pain Pain - part of body: (abdomen)    Time: [id number] PT Time Calculation (min) (ACUTE ONLY): 19 min   Charges:   PT Evaluation $PT Eval Moderate Complexity: 1 Mod          Twanna Resh,PT Acute Rehabilitation Services Pager:  [phone]  Office:  [phone]    Berline LopesDawn F Anise Harbin 08/07/2018, 1:33 PM

## 2018-08-07 NOTE — Progress Notes (Addendum)
[facility] [place]  Subjective  - still a lot of pain   Objective (!) 141/78 97 (!) 100.4 F (38 C) (Axillary) (!) 30 98%  Intake/Output Summary (Last 24 hours) at 08/07/2018 0800 Last data filed at 08/07/2018 0700 Gross per 24 hour  Intake 3152.61 ml  Output 2590 ml  Net 562.61 ml   Abdomen soft incision clean, no real gut function yet no flatus Extremities palpable pedal pulses  Assessment/Planning: Hypertension and tachycardia last pm probably some pain related PCA today Continue esmolol drip for now Restart oral BP meds when ileus resolves Ileus continue NG tube until passing flatus OOB ambulate D/c central line ATN resolving + 11L will start to diurese tomorrow if renal function is stable Acute blood loss anemia thrombocytopenia asymptomatic trend for now Hyponatremia switch IV fluid to NS at 50 ml/hr  Natividad Brood updated by phone  Ruta Hinds 08/07/2018 8:00 AM --  Laboratory Lab Results: Recent Labs    08/06/18 0410 08/07/18 0358  WBC 11.4* 12.2*  HGB 10.7* 9.7*  HCT 32.4* 30.6*  PLT 95* 107*   BMET Recent Labs    08/06/18 0410 08/07/18 0500  NA 131* 129*  K 4.5 4.4  CL 98 99  CO2 26 22  GLUCOSE 286* 544*  BUN 18 13  CREATININE 1.68* 1.14  CALCIUM 7.4* 7.2*    COAG Lab Results  Component Value Date   INR 1.4 (H) 08/05/2018   No results found for: PTT

## 2018-08-08 LAB — BASIC METABOLIC PANEL
Anion gap: 9 (ref 5–15)
Anion gap: 9 (ref 5–15)
BUN: 20 mg/dL (ref 6–20)
BUN: 21 mg/dL — ABNORMAL HIGH (ref 6–20)
CO2: 26 mmol/L (ref 22–32)
CO2: 25 mmol/L (ref 22–32)
Calcium: 8.5 mg/dL — ABNORMAL LOW (ref 8.9–10.3)
Calcium: 8.5 mg/dL — ABNORMAL LOW (ref 8.9–10.3)
Chloride: 101 mmol/L (ref 98–111)
Chloride: 102 mmol/L (ref 98–111)
Creatinine, Ser: 1.31 mg/dL — ABNORMAL HIGH (ref 0.61–1.24)
Creatinine, Ser: 1.25 mg/dL — ABNORMAL HIGH (ref 0.61–1.24)
GFR calc Af Amer: >60 mL/min (ref >60)
GFR calc Af Amer: >60 mL/min (ref >60)
GFR calc non Af Amer: >60 mL/min (ref >60)
GFR calc non Af Amer: >60 mL/min (ref >60)
Glucose, Bld: 102 mg/dL — ABNORMAL HIGH (ref 70–99)
Glucose, Bld: 100 mg/dL — ABNORMAL HIGH (ref 70–99)
Potassium: 5.3 mmol/L — ABNORMAL HIGH (ref 3.5–5.1)
Potassium: 5 mmol/L (ref 3.5–5.1)
Sodium: 136 mmol/L (ref 135–145)
Sodium: 136 mmol/L (ref 135–145)

## 2018-08-08 LAB — BPAM RBC
Blood Product Expiration Date: 202007312359
Blood Product Expiration Date: 202008012359
Blood Product Expiration Date: 202008012359
Blood Product Expiration Date: 202008012359
Blood Product Expiration Date: 202008022359
Blood Product Expiration Date: 202008042359
Blood Product Expiration Date: 202008052359
Blood Product Expiration Date: 202008052359
Blood Product Expiration Date: 202008052359
Blood Product Expiration Date: 202008052359
Blood Product Expiration Date: 202008052359
Blood Product Expiration Date: 202008052359
ISSUE DATE / TIME: 202007101344
ISSUE DATE / TIME: 202007122150
ISSUE DATE / TIME: 202007122150
ISSUE DATE / TIME: 202007122150
ISSUE DATE / TIME: 202007122150
ISSUE DATE / TIME: 202007122256
ISSUE DATE / TIME: 202007122256
ISSUE DATE / TIME: 202007122256
ISSUE DATE / TIME: 202007122256
Unit Type and Rh: 6200
Unit Type and Rh: 6200
Unit Type and Rh: 6200
Unit Type and Rh: 6200
Unit Type and Rh: 6200
Unit Type and Rh: 6200
Unit Type and Rh: 6200
Unit Type and Rh: 6200
Unit Type and Rh: 6200
Unit Type and Rh: 6200
Unit Type and Rh: 6200
Unit Type and Rh: 6200

## 2018-08-08 LAB — GLUCOSE, CAPILLARY
Glucose-Capillary: 113 mg/dL — ABNORMAL HIGH (ref 70–99)
Glucose-Capillary: 70 mg/dL (ref 70–99)
Glucose-Capillary: 73 mg/dL (ref 70–99)
Glucose-Capillary: 76 mg/dL (ref 70–99)
Glucose-Capillary: 82 mg/dL (ref 70–99)
Glucose-Capillary: 98 mg/dL (ref 70–99)
Glucose-Capillary: 99 mg/dL (ref 70–99)

## 2018-08-08 LAB — TYPE AND SCREEN
ABO/RH(D): A POS
Antibody Screen: NEGATIVE
Unit division: 0
Unit division: 0
Unit division: 0
Unit division: 0
Unit division: 0
Unit division: 0
Unit division: 0
Unit division: 0
Unit division: 0
Unit division: 0
Unit division: 0
Unit division: 0

## 2018-08-08 LAB — CBC
HCT: 33 % — ABNORMAL LOW (ref 39.0–52.0)
Hemoglobin: 10.1 g/dL — ABNORMAL LOW (ref 13.0–17.0)
MCH: 27.7 pg (ref 26.0–34.0)
MCHC: 30.6 g/dL (ref 30.0–36.0)
MCV: 90.4 fL (ref 80.0–100.0)
Platelets: 148 10*3/uL — ABNORMAL LOW (ref 150–400)
RBC: 3.65 MIL/uL — ABNORMAL LOW (ref 4.22–5.81)
RDW: 16.1 % — ABNORMAL HIGH (ref 11.5–15.5)
WBC: 11.7 10*3/uL — ABNORMAL HIGH (ref 4.0–10.5)
nRBC: 0 % (ref 0.0–0.2)

## 2018-08-08 LAB — MAGNESIUM: Magnesium: 2.1 mg/dL (ref 1.7–2.4)

## 2018-08-08 MED ORDER — FUROSEMIDE 10 MG/ML IJ SOLN
20.0000 mg | Freq: Once | INTRAMUSCULAR | Status: AC
  Administered 2018-08-08: 10:00:00 20 mg via INTRAVENOUS
  Filled 2018-08-08: qty 2

## 2018-08-08 NOTE — Progress Notes (Addendum)
Progress Note    08/08/2018 8:14 AM 4 Days Post-Op  Subjective:  Says he's having some pain-PCA is helping; not passing any gas  Tm 99.1 now afebrile HR 70's-80's  681'E-751'Z systolic 001% 7CB4WH  Gtts:  Esmolol   Vitals:   08/08/18 0700 08/08/18 0812  BP: (!) 141/83   Pulse: 79   Resp: (!) 22   Temp:  98.6 F (37 C)  SpO2: 100%     Physical Exam: Cardiac:  regular Lungs:  Non labored Incisions:  Clean with staples in tact Extremities:  Palpable DP pulses bilaterally Abdomen:  Soft, NT; no bowel sounds  CBC    Component Value Date/Time   WBC 11.7 (H) 08/08/2018 0223   RBC 3.65 (L) 08/08/2018 0223   HGB 10.1 (L) 08/08/2018 0223   HCT 33.0 (L) 08/08/2018 0223   PLT 148 (L) 08/08/2018 0223   MCV 90.4 08/08/2018 0223   MCH 27.7 08/08/2018 0223   MCHC 30.6 08/08/2018 0223   RDW 16.1 (H) 08/08/2018 0223   LYMPHSABS 0.8 08/05/2018 2116   MONOABS 0.8 08/05/2018 2116   EOSABS 0.2 08/05/2018 2116   BASOSABS 0.0 08/05/2018 2116    BMET    Component Value Date/Time   NA 136 08/08/2018 0223   K 5.3 (H) 08/08/2018 0223   CL 101 08/08/2018 0223   CO2 26 08/08/2018 0223   GLUCOSE 102 (H) 08/08/2018 0223   BUN 20 08/08/2018 0223   CREATININE 1.31 (H) 08/08/2018 0223   CREATININE 1.31 02/21/2013 1622   CALCIUM 8.5 (L) 08/08/2018 0223   GFRNONAA >60 08/08/2018 0223   GFRAA >60 08/08/2018 0223    INR    Component Value Date/Time   INR 1.4 (H) 08/05/2018 0312     Intake/Output Summary (Last 24 hours) at 08/08/2018 0814 Last data filed at 08/08/2018 0700 Gross per 24 hour  Intake 2232.74 ml  Output 2025 ml  Net 207.74 ml     Assessment/Plan:  53 y.o. male is s/p:  Repair rupture AAA  4 Days Post-Op  Cardiac:  Pt in NSR and hemodynamically stable. He is requiring esmolol gtt for keeping systolic < 675.   Will d/w Dr. Oneida Alar about starting scheduled IV metoprolol to help wean of esmolol.  Pulmonary:  Extubated and Oxygen saturation 100% on 2LO2NC;  continue IS every hour Neuro:  In tact GI:  Bowel function slow to return.  -flatus and rare bowel sounds.  Continue NGT until bowel function improves.  400cc out NGT/24hrs. Continue npo. Heme/ID:  Acute surgical blood loss anemia-stable; thrombocytopenia continues to improve.  Check labs in am Renal:  Creatinine improved from pre-op but increased from yesterday at 1.3.  Pt almost 12L positive.   Gentle diuresis today.  Mild Hyperkalemia-do not supplement with diuresis.  Check labs in am. General:  No distress;  Discussed with pt the need for increasing mobilization and out bed today.  Continue PCA today to help with mobilization.  RN to get pt up.  Will order CIR consult for discharge as PT is recommending at this point. DVT prophylaxis:  Continue SCD's for now.  No sq heparin at this point.     Leontine Locket, PA-C [facility] [phone] 08/08/2018 8:14 AM  Agree with above. Can get PRN metoprolol and hydralazine Tranfer to 4E when off Esmo drip Mobilize  Lasix 20 mg now.  Gently diurese over next several days. NG until passing flatus  Ruta Hinds, MD [facility] [place] Office: [phone] Pager:  [phone]   

## 2018-08-08 NOTE — Progress Notes (Signed)
Inpatient Rehabilitation Admissions Coordinator  Inpatient rehab consult received. I met with patient at bedside for rehab assessment. Pt up in chair, states second tim up and has walked about 50 feet today. I discussed the goals and expectations of an inpt rehab admit if he needs that venue once he is medically ready for d/c. Too soon to tell at this time. I will follow his progress to assist with planning dispo as appropriate.  Danne Baxter, RN, MSN Rehab Admissions Coordinator [phone] 08/08/2018 3:22 PM

## 2018-08-09 LAB — GLUCOSE, CAPILLARY
Glucose-Capillary: 70 mg/dL (ref 70–99)
Glucose-Capillary: 87 mg/dL (ref 70–99)
Glucose-Capillary: 94 mg/dL (ref 70–99)
Glucose-Capillary: 95 mg/dL (ref 70–99)
Glucose-Capillary: 98 mg/dL (ref 70–99)

## 2018-08-09 LAB — BASIC METABOLIC PANEL
Anion gap: 8 (ref 5–15)
BUN: 21 mg/dL — ABNORMAL HIGH (ref 6–20)
CO2: 24 mmol/L (ref 22–32)
Calcium: 8.6 mg/dL — ABNORMAL LOW (ref 8.9–10.3)
Chloride: 106 mmol/L (ref 98–111)
Creatinine, Ser: 1.15 mg/dL (ref 0.61–1.24)
GFR calc Af Amer: >60 mL/min (ref >60)
GFR calc non Af Amer: >60 mL/min (ref >60)
Glucose, Bld: 103 mg/dL — ABNORMAL HIGH (ref 70–99)
Potassium: 4.9 mmol/L (ref 3.5–5.1)
Sodium: 138 mmol/L (ref 135–145)

## 2018-08-09 LAB — CBC
HCT: 33.7 % — ABNORMAL LOW (ref 39.0–52.0)
Hemoglobin: 10.7 g/dL — ABNORMAL LOW (ref 13.0–17.0)
MCH: 28.1 pg (ref 26.0–34.0)
MCHC: 31.8 g/dL (ref 30.0–36.0)
MCV: 88.5 fL (ref 80.0–100.0)
Platelets: 206 10*3/uL (ref 150–400)
RBC: 3.81 MIL/uL — ABNORMAL LOW (ref 4.22–5.81)
RDW: 15.7 % — ABNORMAL HIGH (ref 11.5–15.5)
WBC: 11.5 10*3/uL — ABNORMAL HIGH (ref 4.0–10.5)
nRBC: 0 % (ref 0.0–0.2)

## 2018-08-09 MED ORDER — PRO-STAT SUGAR FREE PO LIQD
30.0000 mL | Freq: Two times a day (BID) | ORAL | Status: DC
  Administered 2018-08-09 – 2018-08-10 (×4): 30 mL via ORAL
  Filled 2018-08-09 (×4): qty 30

## 2018-08-09 MED ORDER — FUROSEMIDE 40 MG PO TABS
40.0000 mg | ORAL_TABLET | Freq: Two times a day (BID) | ORAL | Status: DC
  Administered 2018-08-09 – 2018-08-10 (×3): 40 mg via ORAL
  Filled 2018-08-09 (×3): qty 1

## 2018-08-09 MED ORDER — ISOSORB DINITRATE-HYDRALAZINE 20-37.5 MG PO TABS
1.0000 | ORAL_TABLET | Freq: Three times a day (TID) | ORAL | Status: DC
  Administered 2018-08-09 – 2018-08-10 (×6): 1 via ORAL
  Filled 2018-08-09 (×6): qty 1

## 2018-08-09 MED ORDER — CARVEDILOL 25 MG PO TABS
25.0000 mg | ORAL_TABLET | Freq: Two times a day (BID) | ORAL | Status: DC
  Administered 2018-08-09 – 2018-08-10 (×4): 25 mg via ORAL
  Filled 2018-08-09 (×4): qty 1

## 2018-08-09 MED ORDER — BOOST / RESOURCE BREEZE PO LIQD CUSTOM
1.0000 | Freq: Three times a day (TID) | ORAL | Status: DC
  Administered 2018-08-09 – 2018-08-10 (×2): 1 via ORAL

## 2018-08-09 NOTE — Progress Notes (Signed)
[facility] [place]  Subjective  - had BM   Objective 118/78 85 98.5 F (36.9 C) (Oral) (!) 22 97%  Intake/Output Summary (Last 24 hours) at 08/09/2018 1031 Last data filed at 08/09/2018 0900 Gross per 24 hour  Intake 2684.83 ml  Output 1575 ml  Net 1109.83 ml   Currently up walking with PT  Assessment/Planning: POD #4 ruptured AAA Gut function returning will d/c NG and start clears PO BP meds to get him off esmo PT/OT Pt friend updated by phone Continue gentle diuresis   Ruta Hinds 08/09/2018 10:31 AM --  Laboratory Lab Results: Recent Labs    08/08/18 0223 08/09/18 0315  WBC 11.7* 11.5*  HGB 10.1* 10.7*  HCT 33.0* 33.7*  PLT 148* 206   BMET Recent Labs    08/08/18 2128 08/09/18 0315  NA 136 138  K 5.0 4.9  CL 102 106  CO2 25 24  GLUCOSE 100* 103*  BUN 21* 21*  CREATININE 1.25* 1.15  CALCIUM 8.5* 8.6*    COAG Lab Results  Component Value Date   INR 1.4 (H) 08/05/2018   No results found for: PTT

## 2018-08-09 NOTE — Progress Notes (Signed)
Nutrition Follow-up  DOCUMENTATION CODES:   Not applicable  INTERVENTION:   - Boost Breeze po TID, each supplement provides 250 kcal and 9 grams of protein  - Pro-stat 30 ml BID, each supplement provides 100 kcal and 15 grams of protein  NUTRITION DIAGNOSIS:   Inadequate oral intake related to inability to eat as evidenced by NPO status.  Progressing, pt now on clear liquid diet  GOAL:   Patient will meet greater than or equal to 90% of their needs  Progressing  MONITOR:   PO intake, Supplement acceptance, Diet advancement, Labs, I & O's, Skin  REASON FOR ASSESSMENT:   Ventilator    ASSESSMENT:   53 year old male who presented to the ED on 7/12 with abdominal pain. PMH of CHF, asthma, HTN. Abdominal CT showing ruptured infrarenal AAA with retroperitoneal blood.  7/13 - s/p repair of AAA, intubated 7/14 - extubated 7/17 - NGT d/c and pt started on clear liquids  Per Dr. Oneida Alar' note this AM, pt had a BM. However, this has not been documented by nursing.  Spoke with pt at bedside. Pt lethargic but answering RD questions appropriately.  Pt reports that he feels dehydrated and is ready to have something to eat/drink. RD discussed clear liquid diet restrictions. Pt amenable to RD ordering oral nutrition supplements to aid in meeting kcal and protein needs.  Pt denies and N/V at this time and reports he is glad to have NG tube removed.  Noted therapies are recommending CIR.  Medications reviewed and include: Colace, Lasix 40 mg BID, SSI q 4 hours, Protonix, IV esmolol IVF: D5 @ 50 ml/hr, NS @ 10 ml/hr  Labs reviewed. CBG's: 70-87 x 24 hours  UOP: 1225 ml x 24 hours NGT: 500 ml x 24 hours I/O's: +12.9 L since admit  Diet Order:   Diet Order            Diet clear liquid Room service appropriate? Yes; Fluid consistency: Thin; Fluid restriction: 2000 mL Fluid  Diet effective now              EDUCATION NEEDS:   No education needs have been identified at  this time  Skin:  Skin Assessment: Skin Integrity Issues: Incisions: closed incisions to right groin and abdomen  Last BM:  no documented BM  Height:   Ht Readings from Last 1 Encounters:  08/14/2018 6\' 2"  (1.88 m)    Weight:   Wt Readings from Last 1 Encounters:  08/08/18 (!) 157.9 kg    Ideal Body Weight:  86.4 kg  BMI:  Body mass index is 44.69 kg/m.  Estimated Nutritional Needs:   Kcal:  2200-2400  Protein:  115-130 grams  Fluid:  >/= 2.0 L    Gaynell Face, MS, RD, LDN Inpatient Clinical Dietitian Pager: [phone] Weekend/After Hours: [phone]

## 2018-08-09 NOTE — Progress Notes (Signed)
Inpatient Rehabilitation Admissions Coordinator  Noted NGT to be removed and begin clear liquids. I will follow up on Monday to reassess possible need for CIR admit prior to d/c home. [facility] authorization would then be initiated based on his progress.  Danne Baxter, RN, MSN Rehab Admissions Coordinator [phone] 08/09/2018 12:35 PM

## 2018-08-09 NOTE — Progress Notes (Signed)
Occupational Therapy Treatment Patient Details Name: Brandon Snyder MRN: [id number] DOB: [birth year] Today's Date: 08/09/2018    History of present illness Pt is a 53 y/o male with a several day history of abdominal pain. CT scan reveals a ruptured abdominal aortic aneurysm. S/p abdomnial aortic rupture repair 7/13. PMH: HFrEF, HTN, asthma.    OT comments  This 53 yo male admitted with above presents to acute OT with making progress with overall mobility and able to stand today at sink and wash face. He will continue to benefit from acute OT with follow up on CIR to get to an independent to Mod I level.  Follow Up Recommendations  CIR;Supervision/Assistance - 24 hour    Equipment Recommendations  3 in 1 bedside commode    Recommendations for Other Services Rehab consult    Precautions / Restrictions Precautions Precautions: Fall Precaution Comments: NGT, abdominal incision Restrictions Weight Bearing Restrictions: No       Mobility Bed Mobility               General bed mobility comments: Pt in chair upon arrival  Transfers Overall transfer level: Needs assistance Equipment used: Harmon Pier walker) Transfers: Sit to/from [employer] to Stand: Mod assist;+2 safety/equipment;From elevated surface Stand pivot transfers: Min assist;+2 safety/equipment;From elevated surface       General transfer comment: Pt able to stand with +2 mod assist for power up from low recliner.      Balance Overall balance assessment: Needs assistance         Standing balance support: Bilateral upper extremity supported;During functional activity Standing balance-Leahy Scale: Poor Standing balance comment: relies on UE support and external support for balance                           ADL either performed or assessed with clinical judgement   ADL Overall ADL's : Needs assistance/impaired     Grooming: Min guard;Wash/dry face;Standing Grooming Details  (indicate cue type and reason): using EVA walker at sink--had to keep elbow propped on EVA walker and bring his face down to hand                 Toilet Transfer: Minimal assistance;+2 for safety/equipment Toilet Transfer Details (indicate cue type and reason): EVA walker                 Vision Baseline Vision/History: Wears glasses Wears Glasses: At all times Patient Visual Report: No change from baseline            Cognition Arousal/Alertness: Awake/alert Behavior During Therapy: WFL for tasks assessed/performed Overall Cognitive Status: Within Functional Limits for tasks assessed                                 General Comments: appears WFL, labile               General Comments Needed 4LO2 with activity to keep sats >90%. Other VSS    Pertinent Vitals/ Pain       Pain Assessment: Faces Faces Pain Scale: Hurts whole lot Pain Location: abdominal incision Pain Descriptors / Indicators: Discomfort;Grimacing;Operative site guarding Pain Intervention(s): Limited activity within patient's tolerance;Monitored during session;Premedicated before session;PCA encouraged;Repositioned         Frequency  Min 3X/week        Progress Toward Goals  OT Goals(current goals can now be found in  the care plan section)  Progress towards OT goals: Progressing toward goals  Acute Rehab OT Goals Patient Stated Goal: to home   Plan Discharge plan remains appropriate       AM-PAC OT "6 Clicks" Daily Activity     Outcome Measure   Help from another person eating meals?: None(currently NPO--but if not could self feed without issues) Help from another person taking care of personal grooming?: A Lot(from standing position) Help from another person toileting, which includes using toliet, bedpan, or urinal?: A Lot Help from another person bathing (including washing, rinsing, drying)?: A Lot Help from another person to put on and taking off regular upper body  clothing?: A Little Help from another person to put on and taking off regular lower body clothing?: Total 6 Click Score: 14    End of Session Equipment Utilized During Treatment: Gait belt;Oxygen(4 liters when up on feet (2 liters at rest))  OT Visit Diagnosis: Other abnormalities of gait and mobility (R26.89);Muscle weakness (generalized) (M62.81);Pain Pain - part of body: (abdominal incision)   Activity Tolerance Patient tolerated treatment well   Patient Left in chair;with call bell/phone within reach   Nurse Communication Mobility status        Time: [id number] OT Time Calculation (min): 9 min  Charges: OT General Charges $OT Visit: 1 Visit OT Treatments $Self Care/Home Management : 8-22 mins  Ignacia Palmaathy Cardell Rachel, OTR/L [facility] Pager [phone] Office [phone]      Brandon Snyder, Brandon Snyder Eva 08/09/2018, 11:09 AM

## 2018-08-09 NOTE — Progress Notes (Signed)
Physical Therapy Treatment Patient Details Name: Brandon Snyder MRN: [id number] DOB: 20-Feb-1965 Today's Date: 08/09/2018    History of Present Illness Pt is a 53 y/o male with a several day history of abdominal pain. CT scan reveals a ruptured abdominal aortic aneurysm. S/p abdomnial aortic rupture repair 7/13. PMH: HFrEF, HTN, asthma.     PT Comments    Pt admitted with above diagnosis. Pt currently with functional limitations due to balance and endurance deficits. Pt was able to ambulate with min assist of 2 with Harmon Pier walker.  Slow guarded gait.  Pt also needs mod assist for sit to stand.  Pt with poor endurance.  REady for REhab.  Pt will benefit from skilled PT to increase their independence and safety with mobility to allow discharge to the venue listed below.     Follow Up Recommendations  CIR;Supervision/Assistance - 24 hour     Equipment Recommendations  (bariatric rolling walker, wide 3N1)    Recommendations for Other Services       Precautions / Restrictions Precautions Precautions: Fall;Other (comment) Precaution Comments: NGT, abdominal incision Restrictions Weight Bearing Restrictions: No    Mobility  Bed Mobility               General bed mobility comments: Pt in chair  Transfers Overall transfer level: Needs assistance Equipment used: Harmon Pier walker) Transfers: Sit to/from [employer] Sit to Stand: Mod assist;+2 safety/equipment;From elevated surface Stand pivot transfers: Min assist;+2 safety/equipment;From elevated surface       General transfer comment: Pt able to stand with +2 mod assist for power up from low recliner.    Ambulation/Gait Ambulation/Gait assistance: Min assist;+2 safety/equipment Gait Distance (Feet): 70 Feet Assistive device: (Eva walker) Gait Pattern/deviations: Step-through pattern;Decreased stride length;Drifts right/left   Gait velocity interpretation: <1.31 ft/sec, indicative of household ambulator General  Gait Details: Pt was able to ambulate with min assist with one LOB with turns.  Pt ambulates with very guarded gait.  Pt needs cues and assist for safety.  Limited by pain. Hit the PCA x 2 during treatment.    Stairs             Wheelchair Mobility    Modified Rankin (Stroke Patients Only)       Balance Overall balance assessment: Needs assistance         Standing balance support: Bilateral upper extremity supported;During functional activity Standing balance-Leahy Scale: Poor Standing balance comment: relies on UE support and external support for balance                            Cognition Arousal/Alertness: Awake/alert Behavior During Therapy: WFL for tasks assessed/performed(anxious with mobility) Overall Cognitive Status: Within Functional Limits for tasks assessed                                 General Comments: appears WFL, labile       Exercises      General Comments General comments (skin integrity, edema, etc.): Needed 4LO2 with activity to keep sats >90%. Other VSS      Pertinent Vitals/Pain Pain Assessment: Faces Faces Pain Scale: Hurts whole lot Pain Location: abdominal incision Pain Descriptors / Indicators: Discomfort;Grimacing;Operative site guarding Pain Intervention(s): Limited activity within patient's tolerance;Monitored during session;Premedicated before session;PCA encouraged;Repositioned    Home Living  Prior Function            PT Goals (current goals can now be found in the care plan section) Acute Rehab PT Goals Patient Stated Goal: to home  Progress towards PT goals: Progressing toward goals    Frequency    Min 3X/week      PT Plan Current plan remains appropriate    Co-evaluation              AM-PAC PT "6 Clicks" Mobility   Outcome Measure  Help needed turning from your back to your side while in a flat bed without using bedrails?: A Lot Help needed  moving from lying on your back to sitting on the side of a flat bed without using bedrails?: A Lot Help needed moving to and from a bed to a chair (including a wheelchair)?: A Lot Help needed standing up from a chair using your arms (e.g., wheelchair or bedside chair)?: A Lot Help needed to walk in hospital room?: A Lot Help needed climbing 3-5 steps with a railing? : Total 6 Click Score: 11    End of Session Equipment Utilized During Treatment: Gait belt;Oxygen Activity Tolerance: Patient limited by fatigue Patient left: in chair;with call bell/phone within reach;with chair alarm set Nurse Communication: Mobility status PT Visit Diagnosis: Unsteadiness on feet (R26.81);Muscle weakness (generalized) (M62.81);Pain Pain - part of body: (abdomen)     Time: [id number] PT Time Calculation (min) (ACUTE ONLY): 10 min  Charges:  $Gait Training: 8-22 mins                     Larah Kuntzman,PT Acute Rehabilitation Services Pager:  [phone]  Office:  [phone]     Berline LopesDawn F Tresten Pantoja 08/09/2018, 10:58 AM

## 2018-08-10 LAB — CBC
HCT: 36.8 % — ABNORMAL LOW (ref 39.0–52.0)
Hemoglobin: 11.3 g/dL — ABNORMAL LOW (ref 13.0–17.0)
MCH: 28 pg (ref 26.0–34.0)
MCHC: 30.7 g/dL (ref 30.0–36.0)
MCV: 91.1 fL (ref 80.0–100.0)
Platelets: 274 10*3/uL (ref 150–400)
RBC: 4.04 MIL/uL — ABNORMAL LOW (ref 4.22–5.81)
RDW: 16.1 % — ABNORMAL HIGH (ref 11.5–15.5)
WBC: 12.2 10*3/uL — ABNORMAL HIGH (ref 4.0–10.5)
nRBC: 0 % (ref 0.0–0.2)

## 2018-08-10 LAB — BASIC METABOLIC PANEL
Anion gap: 11 (ref 5–15)
BUN: 27 mg/dL — ABNORMAL HIGH (ref 6–20)
CO2: 22 mmol/L (ref 22–32)
Calcium: 8.8 mg/dL — ABNORMAL LOW (ref 8.9–10.3)
Chloride: 107 mmol/L (ref 98–111)
Creatinine, Ser: 1.27 mg/dL — ABNORMAL HIGH (ref 0.61–1.24)
GFR calc Af Amer: >60 mL/min (ref >60)
GFR calc non Af Amer: >60 mL/min (ref >60)
Glucose, Bld: 113 mg/dL — ABNORMAL HIGH (ref 70–99)
Potassium: 4.8 mmol/L (ref 3.5–5.1)
Sodium: 140 mmol/L (ref 135–145)

## 2018-08-10 LAB — GLUCOSE, CAPILLARY
Glucose-Capillary: 100 mg/dL — ABNORMAL HIGH (ref 70–99)
Glucose-Capillary: 101 mg/dL — ABNORMAL HIGH (ref 70–99)
Glucose-Capillary: 98 mg/dL (ref 70–99)
Glucose-Capillary: 111 mg/dL — ABNORMAL HIGH (ref 70–99)

## 2018-08-10 NOTE — Plan of Care (Signed)

## 2018-08-10 NOTE — Progress Notes (Addendum)
[facility] [place]  Subjective  - Off esmolol drip.  States some hiccups with clears.  Sitting up in chair.   Objective 119/73 88 98 F (36.7 C) (Oral) (!) 23 96%  Intake/Output Summary (Last 24 hours) at 08/10/2018 0918 Last data filed at 08/10/2018 0900 Gross per 24 hour  Intake 1522.43 ml  Output 1045 ml  Net 477.43 ml   Objective: Midline c/d/i Some fullness Palpable DP pulses bilaterally  Assessment/Planning: POD #5 ruptured AAA Tolerating CLD, but some fullness and hiccups - will keep on CLD PO BP meds continued, remains off esmolol PT/OT Gentle PO diuresis with home lasix 40 mg BID Transfer to floor given off drips Palpable DP pulses bilaterally  Abdomen looks good - slightly full  Marty Heck 08/10/2018 9:18 AM --  Laboratory Lab Results: Recent Labs    08/09/18 0315 08/10/18 0554  WBC 11.5* 12.2*  HGB 10.7* 11.3*  HCT 33.7* 36.8*  PLT 206 274   BMET Recent Labs    08/09/18 0315 08/10/18 0554  NA 138 140  K 4.9 4.8  CL 106 107  CO2 24 22  GLUCOSE 103* 113*  BUN 21* 27*  CREATININE 1.15 1.27*  CALCIUM 8.6* 8.8*    COAG Lab Results  Component Value Date   INR 1.4 (H) 08/05/2018   No results found for: PTT

## 2018-08-11 ENCOUNTER — Inpatient Hospital Stay (HOSPITAL_COMMUNITY): Payer: 59 | Admitting: Certified Registered"

## 2018-08-11 LAB — GLUCOSE, CAPILLARY
Glucose-Capillary: 103 mg/dL — ABNORMAL HIGH (ref 70–99)
Glucose-Capillary: 105 mg/dL — ABNORMAL HIGH (ref 70–99)
Glucose-Capillary: 109 mg/dL — ABNORMAL HIGH (ref 70–99)

## 2018-08-12 MED FILL — Medication: Qty: 1 | Status: AC

## 2018-08-24 NOTE — Anesthesia Procedure Notes (Signed)
Procedure Name: Intubation Date/Time: 2018/08/28 5:43 AM Performed by: Babs Bertin, CRNA Pre-anesthesia Checklist: Emergency Drugs available, Suction available and Patient being monitored Oxygen Delivery Method: Ambu bag Preoxygenation: Pre-oxygenation with 100% oxygen Laryngoscope Size: Mac and 4 Grade View: Grade II Tube type: Subglottic suction tube Tube size: 7.5 mm Number of attempts: 1 Airway Equipment and Method: Stylet and Video-laryngoscopy Placement Confirmation: ETT inserted through vocal cords under direct vision,  breath sounds checked- equal and bilateral and CO2 detector Secured at: 23 cm Tube secured with: Tape Dental Injury: Teeth and Oropharynx as per pre-operative assessment

## 2018-08-24 NOTE — Progress Notes (Signed)
   07/24/2018 3299  Clinical Encounter Type  Visited With Patient  Visit Type Initial;Code;Spiritual support;Death  Referral From Nurse  Consult/Referral To Chaplain  This chaplain responded to Pt. Code Blue.  The chaplain was pastorally present with the Pt. and healthcare team during the code.  The chaplain joined the the MD during the phone call to the Pt. fiancee-Ms. Pearline Cables.  At this time Ms Pearline Cables was informed the Pt. did not survive the Code. The chaplain listened to Ms Pearline Cables over the phone and recognized her distress. This chaplain offered F/U spiritual care for the Pt. fiancee and healthcare team before leaving the unit.

## 2018-08-24 NOTE — Discharge Summary (Signed)
Discharge Summary    Brandon ConnersDamon L Snyder [birth year] 53 y.o. 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 male with a several day history of abdominal pain.  He had had some nausea and vomiting.  This was initially thought to be gastroenteritis.  He had no fever.  The abdominal pain continued to get worse.  [facility].  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  Other medical problems include hypertension which is controlled.  He has no history of tobacco abuse.  He has no family history of abdominal aortic aneurysm.  He does have a history of congestive heart failure.  Echocardiogram in 2014 showed an ejection fraction 30 to 35%.  The patient was transported by ambulance to [facility].  He was hemodynamically stable during transport according to EMS.  He was noted to have a preoperative creatinine of 4.8.  The patient does not know of any prior history of renal dysfunction.  Hospital Course:  The patient was admitted to the hospital and taken to the operating room on Jun 07, 2018 - 08/05/2018 and underwent: Repair of ruptured abdominal aortic aneurysm (18 mm straight dacron graft)    Operative findings: #1 right femoral 12 French dry seal sheath placed for possible balloon vascular control  The pt tolerated the procedure well and was transported to the PACU in critical but stable condition.   DOS, pt relatively stable in ICU with good uop, his abdomen obese, not tense, soft. BP 150 systolic.   Review of labs shows thrombocytopenia with platelet count of 80 PT is still slightly elevated both suggesting coagulopathy will transfuse 2 units of FFP and a sixpack of platelets.  He did remain intubated.    Patient's creatinine is 3 slightly down from 4 most  likely this was prerenal from prior to his operation.  Continue large volume fluid resuscitation to hydrate his kidney  Hemoglobin is 12 which again suggests either some hemoconcentration and volume needs certainly does not seem to have ongoing bleeding currently has hemoglobin has now been stable for about 2-1/2 hours.  POD 1, his renal function was improving.  Continue IVF to treat oliguria or tachycardia.  Hgb stable.  Pt was extubated later that morning.   POD 2, pt was on PCA due to pain.  He did have some HTN and tachycardia and was on esmolol gtt.  His NGT was continued until passing flatus.  Central line removed.  ATN resolving.  He did have ABA and thrombocytopenia that was asymptomatic.  Hyponatremia.  IVF to NS at 50cc/hr.  POD 3, pt hemodynamically stable but still requiring esmolol for keeping systolic < 140.  He was doing well with extubation with O2 sat at 100% on 2LO2NC.  Continue IS.  Bowel function slow to return.  No flatus and rare bowel sounds.  Continue NGT until this improves and npo.  Mild diuresis.  CIR consulted as PT recommending.  SCD's continued as no sq heparin for high risk of bleeding.   POD 4, bowel function returning.  NGT discontinued and he was started on clear liquid diet.  Continue gentle diuresis.    POD 5, he was tolerating CLD.  He was having some fullness and hiccups and was kept on CLD.  He was transferred to  the floor given he was off gtts.  He had palpable DP pulses bilaterally.  Continue PT/OT.  POD 6, MD called at 5:50 AM and notified that Brandon Snyder was noted to be in PEA arrest around 5:30 AM during a bath and a subsequent CODE BLUE was called.  On my arrival patient had received 30 minutes of CPR for PEA arrest with 9 rounds of epinephrine as well as bicarb with no evidence of ROSC.  Some suspicion that he may have aspirated during his bath given gastric contents noted after ET tube placement.  ACLS protocol was continued for over 30 minutes from his  initial event.  Ultimately the team decided there was no further intervention that we could provide.  A final pulse check was performed.  Time of death was around 6 AM.  I notified his significant other Brandon Snyder who is his fiance and primary contact in the chart.  I informed her that unfortunately he had passed away following PEA arrest event this morning.  I did have the chaplain speak with her as well.  I did provide the number for patient placement when she decides on funeral home.  I answered all of her questions very unfortunate due to his untimely death given he looked so good yesterday.   CBC    Component Value Date/Time   WBC 12.2 (H) 08/10/2018 0554   RBC 4.04 (L) 08/10/2018 0554   HGB 11.3 (L) 08/10/2018 0554   HCT 36.8 (L) 08/10/2018 0554   PLT 274 08/10/2018 0554   MCV 91.1 08/10/2018 0554   MCH 28.0 08/10/2018 0554   MCHC 30.7 08/10/2018 0554   RDW 16.1 (H) 08/10/2018 0554   LYMPHSABS 0.8 08/05/2018 2116   MONOABS 0.8 08/05/2018 2116   EOSABS 0.2 08/05/2018 2116   BASOSABS 0.0 08/05/2018 2116    BMET    Component Value Date/Time   NA 140 08/10/2018 0554   K 4.8 08/10/2018 0554   CL 107 08/10/2018 0554   CO2 22 08/10/2018 0554   GLUCOSE 113 (H) 08/10/2018 0554   BUN 27 (H) 08/10/2018 0554   CREATININE 1.27 (H) 08/10/2018 0554   CREATININE 1.31 02/21/2013 1622   CALCIUM 8.8 (L) 08/10/2018 0554   GFRNONAA >60 08/10/2018 0554   GFRAA >60 08/10/2018 0554        Discharge Diagnosis:  Ruptured aortic aneurysm, 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 AAA (abdominal aortic aneurysm, ruptured) ([place]) 08/15/2018  . Acute exacerbation of CHF (congestive heart failure) ([place]) 05/08/2012  . HTN (hypertension) 05/08/2012   Past Medical History:  Diagnosis Date  . Asthma   . CHF (congestive heart failure) ([place])   . Hypertension   . Pneumonia      Disposition: deceased   Leontine Locket, [facility] [phone] 08/20/2018  11:05 AM

## 2018-08-24 NOTE — Progress Notes (Signed)
Called this morning at 5:50 AM and notified that Brandon Snyder was noted to be in PEA arrest around 5:30 AM during a bath and a subsequent CODE BLUE was called.  On my arrival patient had received 30 minutes of CPR for PEA arrest with 9 rounds of epinephrine as well as bicarb with no evidence of ROSC.  Some suspicion that he may have aspirated during his bath given gastric contents noted after ET tube placement.  ACLS protocol was continued for over 30 minutes from his initial event.  Ultimately the team decided there was no further intervention that we could provide.  A final pulse check was performed.  Time of death was around 6 AM.  I notified his significant other Brandon Snyder who is his fiance and primary contact in the chart.  I informed her that unfortunately he had passed away following PEA arrest event this morning.  I did have the chaplain speak with her as well.  I did provide the number for patient placement when she decides on funeral home.  I answered all of her questions very unfortunate due to his untimely death given he looked so good yesterday.  Marty Heck, MD [facility] [place] Office: [phone] Pager: [place]

## 2018-08-24 NOTE — Code Documentation (Signed)
  Patient Name: Brandon Snyder   MRN: [id number]   Date of Birth/ Sex: 29-May-1965 , male      Admission Date: 08/07/2018  Attending Provider: Elam Dutch, MD  Primary Diagnosis: Ruptured aortic aneurysm, unspecified portion of aorta ([place]) [I71.8]   Indication: Pt was in his usual state of health until this AM, when he was noted to be PEA arrest. Code blue was subsequently called. At the time of arrival on scene, ACLS protocol was underway.   Technical Description:  - CPR performance duration:  30 minute  - Was defibrillation or cardioversion used? No   - Was external pacer placed? Yes  - Was patient intubated pre/post CPR? Yes   Medications Administered: Y = Yes; Blank = No Amiodarone    Atropine    Calcium    Epinephrine  Y  Lidocaine    Magnesium    Norepinephrine    Phenylephrine    Sodium bicarbonate  Y  Vasopressin    Other    Post CPR evaluation:  - Final Status - Was patient successfully resuscitated ? No   Miscellaneous Information:  - Time of death:  6 AM  - Primary team notified?  Yes  - Family Notified? Yes by primary     Marianna Payment, MD   September 10, 2018, 6:22 AM

## 2018-08-24 NOTE — Progress Notes (Signed)
30 ml of morphine wasted in the steri with Corie Chiquito, RN.

## 2018-08-24 NOTE — Progress Notes (Signed)
Dr. Carlis Abbott informed pt became unresponsive, no pulse and is now in a Code Blue.

## 2018-08-24 NOTE — Progress Notes (Signed)
Patient deceased this morning at 0600 after 30 minutes of CPR. Death was pronounced by Dr Monica Martinez who notified patient's significant other. Family mentioned requesting an autopsy, therefore all equipment was left on body. [place] donor service notified at [phone], [place] number [phone]. Patient is a potential tissue and eye donor.

## 2018-08-24 NOTE — Progress Notes (Signed)
Patient brought to morgue at 08:20 and placed on rack by security. Body was tagged, patient belonging bags(2 and placed on top of body as told by security), and death certificate brought to morgue as well. Death certificate placed in mailbox ( for ME cases/ death certificates) as recommended by security (could have placed with body as well).

## 2018-08-24 NOTE — Progress Notes (Signed)
At the time of death, Dr. Carlis Abbott noted that this was not an ME case. The significant other mentioned "the family might". I spoke w/ pt's son Brandon Snyder [phone]; he stated he did not want an autopsy. HE provided information already about the funeral home for the body to be released to. His mailing address for the death certificate mailing would be: [address] [place], [place] [address].  Condolescences were given. ---JM    ME office was called earlier trying to determine some of the confusion and they stated no reason for ME exam. --JM

## 2018-08-24 DEATH — deceased
